# Patient Record
Sex: Female | Born: 1975 | Hispanic: No | Smoking: Current every day smoker
Health system: Southern US, Community
[De-identification: ages and names within clinical notes are randomized; demographics above are authoritative.]

## PROBLEM LIST (undated history)

## (undated) DIAGNOSIS — M5431 Sciatica, right side: Secondary | ICD-10-CM

## (undated) DIAGNOSIS — I10 Essential (primary) hypertension: Secondary | ICD-10-CM

## (undated) DIAGNOSIS — E785 Hyperlipidemia, unspecified: Secondary | ICD-10-CM

## (undated) HISTORY — DX: Essential (primary) hypertension: I10

## (undated) HISTORY — DX: Sciatica, right side: M54.31

## (undated) HISTORY — DX: Hyperlipidemia, unspecified: E78.5

---
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
## 2003-12-03 HISTORY — PX: HAND SURGERY: SHX662

## 2020-01-31 HISTORY — PX: INTRAUTERINE DEVICE INSERTION: SHX323

## 2021-07-18 ENCOUNTER — Ambulatory Visit: Payer: Self-pay | Admitting: Family Medicine

## 2021-07-24 ENCOUNTER — Ambulatory Visit: Payer: Self-pay | Admitting: Internal Medicine

## 2021-08-16 DIAGNOSIS — R030 Elevated blood-pressure reading, without diagnosis of hypertension: Secondary | ICD-10-CM | POA: Diagnosis not present

## 2021-08-16 DIAGNOSIS — Z713 Dietary counseling and surveillance: Secondary | ICD-10-CM | POA: Diagnosis not present

## 2021-08-16 DIAGNOSIS — Z131 Encounter for screening for diabetes mellitus: Secondary | ICD-10-CM | POA: Diagnosis not present

## 2021-08-16 DIAGNOSIS — Z136 Encounter for screening for cardiovascular disorders: Secondary | ICD-10-CM | POA: Diagnosis not present

## 2021-08-16 DIAGNOSIS — Z13220 Encounter for screening for lipoid disorders: Secondary | ICD-10-CM | POA: Diagnosis not present

## 2021-09-24 ENCOUNTER — Encounter: Payer: Self-pay | Admitting: Family Medicine

## 2021-09-24 ENCOUNTER — Ambulatory Visit: Payer: BC Managed Care – PPO | Admitting: Family Medicine

## 2021-09-24 ENCOUNTER — Other Ambulatory Visit: Payer: Self-pay

## 2021-09-24 VITALS — BP 127/70 | HR 63 | Ht 62.0 in | Wt 132.4 lb

## 2021-09-24 DIAGNOSIS — M5431 Sciatica, right side: Secondary | ICD-10-CM

## 2021-09-24 DIAGNOSIS — G5701 Lesion of sciatic nerve, right lower limb: Secondary | ICD-10-CM

## 2021-09-24 DIAGNOSIS — Z7689 Persons encountering health services in other specified circumstances: Secondary | ICD-10-CM | POA: Diagnosis not present

## 2021-09-24 DIAGNOSIS — Z23 Encounter for immunization: Secondary | ICD-10-CM

## 2021-09-24 MED ORDER — BACLOFEN 10 MG PO TABS: 5.0000 mg | ORAL_TABLET | Freq: Three times a day (TID) | ORAL | 2 refills | Status: DC | PRN

## 2021-09-24 MED ORDER — GABAPENTIN 100 MG PO CAPS
ORAL_CAPSULE | ORAL | 2 refills | Status: DC
Start: 2021-09-24 — End: 2021-10-12
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
## 2021-09-24 NOTE — Patient Instructions (Addendum)
Thank you for coming to the office today.  Dr Joseph Berkshire  [facility] [address] [place], [place] [address] [phone]  Stay tuned for apt upcoming.  Start Gabapentin 100mg  capsules, take at night for 2-3 nights only, and then increase to 2 times a day for a few days, and then may increase to 3 times a day, it may make you drowsy, if helps significantly at night only, then you can increase instead to 3 capsules at night, instead of 3 times a day - In the future if needed, we can significantly increase the dose if tolerated well, some common doses are 300mg  three times a day up to 600mg  three times a day, usually it takes several weeks or months to get to higher doses  Start taking Baclofen (Lioresal) 10mg  (muscle relaxant) - start with half (cut) to one whole pill at night as needed for next 1-3 nights (may make you drowsy, caution with driving) see how it affects you, then if tolerated increase to one pill 2 to 3 times a day or (every 8 hours as needed)   Please schedule a Follow-up Appointment to: Return in about 3 months (around 12/25/2021) for 3 month follow-up piriformis/sciatica.  If you have any other questions or concerns, please feel free to call the office or send a message through MyChart. You may also schedule an earlier appointment if necessary.  Additionally, you may be receiving a survey about your experience at our office within a few days to 1 week by e-mail or mail. We value your feedback.  , DO [facility], [facility]     Piriformis Syndrome Rehabilitation Exercises   You may do all of these exercises right away once acute pain starts to improve on medication or treatment.  Piriformis stretch: Lying on your back with both knees bent, rest the ankle of your injured leg over the knee of your uninjured leg. Grasp the thigh of your uninjured leg and pull that knee toward your chest. You will feel a stretch along the  buttocks and possibly along the outside of your hip on the injured side. Hold this for 15 to 30 seconds. Repeat 3 times.  Standing hamstring stretch: Place the heel of your leg on a stool about 15 inches high. Keep your knee straight. Lean forward, bending at the hips until you feel a mild stretch in the back of your thigh. Make sure you do not roll your shoulders and bend at the waist when doing this or you will stretch your lower back instead. Hold the stretch for 15 to 30 seconds. Repeat 3 times.  Pelvic tilt: Lie on your back with your knees bent and your feet flat on the floor. Tighten your abdominal muscles and push your lower back into the floor. Hold this position for 5 seconds, then relax. Do 3 sets of 10.  Partial curl: Lie on your back with your knees bent and your feet flat on the floor. Tighten your stomach muscles and flatten your back against the floor. Tuck your chin to your chest. With your hands stretched out in front of you, curl your upper body forward until your shoulders clear the floor. Hold this position for 3 seconds. Don't hold your breath. It helps to breathe out as you lift your shoulders up. Relax. Repeat 10 times. Build to 3 sets of 10. To challenge yourself, clasp your hands behind your head and keep your elbows out to the side.  Prone  hip extension: Lie on your stomach with your legs straight out behind you. Tighten up your buttocks muscles and lift one leg off the floor about 8 inches. Keep your knee straight. Hold for 5 seconds. Then lower your leg and relax. Do 3 sets of 10.  If both legs are affected, you may repeat this exercise for the other leg.   Piriformis Syndrome Piriformis syndrome is a condition that can cause pain and numbness in your buttocks and down the back of your leg. Piriformis syndrome happens when the small muscle that connects the base of your spine to your hip (piriformis muscle) presses on the nerve that runs down the back of your leg (sciatic  nerve). The piriformis muscle helps your hip rotate and helps to bring your leg back and out. It also helps shift your weight to keep you stable while you are walking. The sciatic nerve runs under or through the piriformis muscle. Damage to the piriformis muscle can cause spasms that put pressure on the nerve below. This causes pain and discomfort while sitting and moving. The pain may feel as if it begins in the buttock and spreads (radiates) down your hip and thigh. What are the causes? This condition is caused by pressure on the sciatic nerve from the piriformis muscle. The piriformis muscle can get irritated with overuse, especially if other hip muscles are weak and the piriformis muscle has to do extra work. Piriformis syndrome can also occur after an injury, like a fall onto your buttocks. What increases the risk? You are more likely to develop this condition if you: Are a woman. Sit for long periods of time. Are a cyclist. Have weak buttocks muscles (gluteal muscles). What are the signs or symptoms? Symptoms of this condition include: Pain, tingling, or numbness that starts in the buttock and runs down the back of your leg (sciatica). Pain in the groin or thigh area. Your symptoms may get worse: The longer you sit. When you walk, run, or climb stairs. When straining to have a bowel movement. How is this diagnosed? This condition is diagnosed based on your symptoms, medical history, and physical exam. During the exam, your health care provider may: Move your leg into different positions to check for pain. Press on the muscles of your hip and buttock to see if that increases your symptoms. You may also have tests, including: Imaging tests such as X-rays, MRI, or ultrasound. Electromyogram (EMG). This test measures electrical signals sent by your nerves into the muscles. Nerve conduction study. This test measures how well electrical signals pass through your nerves. How is this  treated? This condition may be treated by: Stopping all activities that cause pain or make your condition worse. Applying ice or using heat therapy. Taking medicines to reduce pain and swelling. Taking a muscle relaxer (muscle relaxant) to stop muscle spasms. Doing range-of-motion and strengthening exercises (physical therapy) as told by your health care provider. Massaging the area. Having acupuncture. Getting an injection of medicine in the piriformis muscle. Your health care provider will choose the medicine based on your condition. He or she may inject: An anti-inflammatory medicine (steroid) to reduce swelling. A numbing medicine (local anesthetic) to block the pain. Botulinum toxin. The toxin blocks nerve impulses to specific muscles to reduce muscle tension. In rare cases, you may need surgery to cut the muscle and release pressure on the nerve if other treatments do not work. Follow these instructions at home: Activity Do not sit for long periods. Get  up and walk around every 20 minutes or as often as told by your health care provider. When driving long distances, make sure to take frequent stops to get up and stretch. Use a cushion when you sit on hard surfaces. Do exercises as told by your health care provider. Return to your normal activities as told by your health care provider. Ask your health care provider what activities are safe for you. Managing pain, stiffness, and swelling   If directed, apply heat to the affected area as often as told by your health care provider. Use the heat source that your health care provider recommends, such as a moist heat pack or a heating pad. Place a towel between your skin and the heat source. Leave the heat on for 20-30 minutes. Remove the heat if your skin turns bright red. This is especially important if you are unable to feel pain, heat, or cold. You may have a greater risk of getting burned. If directed, put ice on the injured area. Put  ice in a plastic bag. Place a towel between your skin and the bag. Leave the ice on for 20 minutes, 2-3 times a day. General instructions Take over-the-counter and prescription medicines only as told by your health care provider. Ask your health care provider if the medicine prescribed to you requires you to avoid driving or using heavy machinery. You may need to take actions to prevent or treat constipation, such as: Drink enough fluid to keep your urine pale yellow. Take over-the-counter or prescription medicines. Eat foods that are high in fiber, such as beans, whole grains, and fresh fruits and vegetables. Limit foods that are high in fat and processed sugars, such as fried or sweet foods. Keep all follow-up visits as told by your health care provider. This is important. How is this prevented? Do not sit for longer than 20 minutes at a time. When you sit, choose padded surfaces. Warm up and stretch before being active. Cool down and stretch after being active. Give your body time to rest between periods of activity. Make sure to use equipment that fits you. Maintain physical fitness, including: Strength. Flexibility. Contact a health care provider if: Your pain and stiffness continue or get worse. Your leg or hip becomes weak. You have changes in your bowel function or bladder function. Summary Piriformis syndrome is a condition that can cause pain, tingling, and numbness in your buttocks and down the back of your leg. You may try applying heat or ice to relieve the pain. Do not sit for long periods. Get up and walk around every 20 minutes or as often as told by your health care provider. This information is not intended to replace advice given to you by your health care provider. Make sure you discuss any questions you have with your health care provider. Document Revised: 03/11/2019 Document Reviewed: 07/15/2018 Elsevier Patient Education  2022 [employer].

## 2021-09-27 ENCOUNTER — Ambulatory Visit
Admission: RE | Admit: 2021-09-27 | Discharge: 2021-09-27 | Disposition: A | Discharge status: Home / Self Care | Payer: BC Managed Care – PPO | Source: Ambulatory Visit | Attending: Family Medicine | Admitting: Family Medicine

## 2021-09-27 ENCOUNTER — Encounter: Payer: Self-pay | Admitting: Family Medicine

## 2021-09-27 ENCOUNTER — Other Ambulatory Visit: Payer: Self-pay

## 2021-09-27 ENCOUNTER — Ambulatory Visit
Admission: RE | Admit: 2021-09-27 | Discharge: 2021-09-27 | Disposition: A | Discharge status: Home / Self Care | Payer: BC Managed Care – PPO | Attending: Family Medicine | Admitting: Family Medicine

## 2021-09-27 ENCOUNTER — Ambulatory Visit (INDEPENDENT_AMBULATORY_CARE_PROVIDER_SITE_OTHER): Payer: BC Managed Care – PPO | Admitting: Family Medicine

## 2021-09-27 VITALS — BP 128/72 | HR 87 | Ht 62.0 in | Wt 131.0 lb

## 2021-09-27 DIAGNOSIS — M5431 Sciatica, right side: Secondary | ICD-10-CM | POA: Insufficient documentation

## 2021-09-27 DIAGNOSIS — M545 Low back pain, unspecified: Secondary | ICD-10-CM | POA: Diagnosis not present

## 2021-09-27 DIAGNOSIS — G5701 Lesion of sciatic nerve, right lower limb: Secondary | ICD-10-CM

## 2021-09-27 MED ORDER — PREDNISONE 50 MG PO TABS: 50.0000 mg | ORAL_TABLET | Freq: Every day | ORAL | 0 refills | Status: DC

## 2021-09-27 MED ORDER — MELOXICAM 15 MG PO TABS: 15.0000 mg | ORAL_TABLET | Freq: Every day | ORAL | 0 refills | Status: DC

## 2021-09-27 NOTE — Assessment & Plan Note (Signed)
See additional assessment(s) for plan details. 

## 2021-09-27 NOTE — Patient Instructions (Signed)
-   Obtain low back x-rays - Continue previously prescribed medications by Dr. Althea Charon, increase slowly as he has instructed - Dose prednisone once daily until complete - After prednisone complete, stop meloxicam and dose once daily with food - This weekend start home exercises and focus on gradual advance - Return for follow-up in 2 weeks, contact us for questions between now and then

## 2021-09-27 NOTE — Assessment & Plan Note (Signed)
Patient with chronic right gluteal pain with paresthesias to the right foot, denies any overt weakness, no bowel/bladder dysfunction.  She denies any overt back pain but does feel frequent "popping "of the low back with regular movement.  Pain is progressively worsened with activity, particularly weightbearing.  Some alleviation with slight hip flexion and flexed knee positioning.  She has noted some improvement following her primary care physician, Dr., Althea Charon' prescription for gabapentin and baclofen, plans to titrate this dose up today or tomorrow.  Physical examination findings reveals painful lumbar extension, tenderness at the right SI joint, gluteus medius mid/distal to the posterior greater trochanter on the right, maximal tenderness at the piriformis, positive Faber, positive piriformis stretch, negative straight leg raise, iliopsoas testing benign.  There is positive facet loading of the lumbosacral spine with appreciable crepitations at shift.  Sensorimotor otherwise intact and symmetric bilateral lower extremities.  Her physical examination findings and clinical history are most consistent with piriformis syndrome on the right side as noted by her PCP, this can be considered secondary/compensatory to possible generalized deconditioning versus a sequela from a lumbosacral spondylosis.  Given her findings we will plan to obtain lumbar films and given her stated severity of symptoms, 5-day course of prednisone followed by once daily meloxicam, modification of home exercises, and continued medications as per Dr. Althea Charon.  She will maintain close follow-up in 2 weeks for reevaluation, if suboptimal response noted, pending focality of symptoms and radiographs, ultrasound-guided piriformis injection at the junction of the static nerve to be considered as can right sacroiliac joint injection.  Once symptoms adequately controlled, she would benefit from formal PT versus continue to advance with  home-based rehab.

## 2021-09-27 NOTE — Progress Notes (Signed)
Primary Care / Sports Medicine Office Visit  Patient Information:  Patient ID: Sonia Eaton, female DOB: 11/30/1976 Age: 45 y.o. MRN: [id number]   Jaydyn Menon is a pleasant 45 y.o. female presenting with the following:  Chief Complaint  Patient presents with   Piriformis syndrome of right side    Several years per patient, 2014; incarcerated from 2015-2021 so unable to seek medical care until now; no known trauma or injury; prescribed gabapentin 100 mg and baclofen as needed via PCP 09/24/21 with no relief; no imaging; 10/10 pain   Chronic sciatica, right    Review of Systems pertinent details above   Patient Active Problem List   Diagnosis Date Noted   Piriformis syndrome of right side 09/24/2021   Chronic sciatica, right 09/24/2021   Past Medical History:  Diagnosis Date   Hyperlipidemia    Hypertension    Sciatica of right side    Outpatient Encounter Medications as of 09/27/2021  Medication Sig   Aspirin-Salicylamide-Caffeine (BC HEADACHE POWDER PO) Take 1 Package by mouth 2 (two) times a week.   baclofen (LIORESAL) 10 MG tablet Take 0.5-1 tablets (5-10 mg total) by mouth 3 (three) times daily as needed for muscle spasms.   Buprenorphine HCl-Naloxone HCl 8-2 MG FILM Place 1 Film under the tongue daily.   esomeprazole (NEXIUM) 20 MG capsule Take 20 mg by mouth daily.   gabapentin (NEURONTIN) 100 MG capsule Start 1 capsule daily, increase by 1 cap every 2-3 days as tolerated up to 3 times a day, or may take 3 at once in evening.   ibuprofen (ADVIL) 200 MG tablet Take 200 mg by mouth every 6 (six) hours as needed.   meloxicam (MOBIC) 15 MG tablet Take 1 tablet (15 mg total) by mouth daily.   predniSONE (DELTASONE) 50 MG tablet Take 1 tablet (50 mg total) by mouth daily.   No facility-administered encounter medications on file as of 09/27/2021.   Past Surgical History:  Procedure Laterality Date   CESAREAN SECTION  2014   CHOLECYSTECTOMY  1996   HAND SURGERY  2005    carpal tunnel   INTRAUTERINE DEVICE INSERTION  01/2020    Vitals:   09/27/21 1447  BP: 128/72  Pulse: 87  SpO2: 98%   Vitals:   09/27/21 1447  Weight: 131 lb (59.4 kg)  Height: 5\' 2"  (1.575 m)   Body mass index is 23.96 kg/m.  No results found.   Independent interpretation of notes and tests performed by another provider:   None  Procedures performed:   None  Pertinent History, Exam, Impression, and Recommendations:   Piriformis syndrome of right side Patient with chronic right gluteal pain with paresthesias to the right foot, denies any overt weakness, no bowel/bladder dysfunction.  She denies any overt back pain but does feel frequent "popping "of the low back with regular movement.  Pain is progressively worsened with activity, particularly weightbearing.  Some alleviation with slight hip flexion and flexed knee positioning.  She has noted some improvement following her primary care physician, Dr., ' prescription for gabapentin and baclofen, plans to titrate this dose up today or tomorrow.  Physical examination findings reveals painful lumbar extension, tenderness at the right SI joint, gluteus medius mid/distal to the posterior greater trochanter on the right, maximal tenderness at the piriformis, positive Faber, positive piriformis stretch, negative straight leg raise, iliopsoas testing benign.  There is positive facet loading of the lumbosacral spine with appreciable crepitations at shift.  Sensorimotor otherwise intact  and symmetric bilateral lower extremities.  Her physical examination findings and clinical history are most consistent with piriformis syndrome on the right side as noted by her PCP, this can be considered secondary/compensatory to possible generalized deconditioning versus a sequela from a lumbosacral spondylosis.  Given her findings we will plan to obtain lumbar films and given her stated severity of symptoms, 5-day course of prednisone  followed by once daily meloxicam, modification of home exercises, and continued medications as per Dr. Althea Charon.  She will maintain close follow-up in 2 weeks for reevaluation, if suboptimal response noted, pending focality of symptoms and radiographs, ultrasound-guided piriformis injection at the junction of the static nerve to be considered as can right sacroiliac joint injection.  Once symptoms adequately controlled, she would benefit from formal PT versus continue to advance with home-based rehab.  Chronic sciatica, right See additional assessment(s) for plan details.   Orders & Medications Meds ordered this encounter  Medications   predniSONE (DELTASONE) 50 MG tablet    Sig: Take 1 tablet (50 mg total) by mouth daily.    Dispense:  5 tablet    Refill:  0   meloxicam (MOBIC) 15 MG tablet    Sig: Take 1 tablet (15 mg total) by mouth daily.    Dispense:  14 tablet    Refill:  0   Orders Placed This Encounter  Procedures   DG Lumbar Spine Complete     Return in about 2 weeks (around 10/11/2021).     Jerrol Banana, MD   Primary Care Sports Medicine [facility]

## 2021-10-09 ENCOUNTER — Ambulatory Visit: Payer: BC Managed Care – PPO | Admitting: Family Medicine

## 2021-10-12 ENCOUNTER — Encounter: Payer: Self-pay | Admitting: Family Medicine

## 2021-10-12 ENCOUNTER — Ambulatory Visit: Payer: BC Managed Care – PPO | Admitting: Family Medicine

## 2021-10-12 ENCOUNTER — Other Ambulatory Visit: Payer: Self-pay

## 2021-10-12 VITALS — BP 142/80 | HR 80 | Ht 63.0 in | Wt 130.0 lb

## 2021-10-12 DIAGNOSIS — M5431 Sciatica, right side: Secondary | ICD-10-CM

## 2021-10-12 DIAGNOSIS — G5701 Lesion of sciatic nerve, right lower limb: Secondary | ICD-10-CM | POA: Diagnosis not present

## 2021-10-12 DIAGNOSIS — M5416 Radiculopathy, lumbar region: Secondary | ICD-10-CM | POA: Diagnosis not present

## 2021-10-12 MED ORDER — MELOXICAM 15 MG PO TABS: 15.0000 mg | ORAL_TABLET | Freq: Every day | ORAL | 1 refills | Status: DC

## 2021-10-12 MED ORDER — GABAPENTIN 600 MG PO TABS: 600.0000 mg | ORAL_TABLET | Freq: Every day | ORAL | 3 refills | Status: DC

## 2021-10-12 NOTE — Assessment & Plan Note (Signed)
See additional assessment(s) for plan details. 

## 2021-10-12 NOTE — Progress Notes (Signed)
Primary Care / Sports Medicine Office Visit  Patient Information:  Patient ID: Sonia Eaton, female DOB: [birth year] Age: 45 y.o. MRN: [id number]   Sonia Eaton is a pleasant 45 y.o. female presenting with the following:  Chief Complaint  Patient presents with   Follow-up    Patient continues to have side pain that now radiates down her leg.  She is continuing to take her Gabapentin and Baclofen.  She has finishsed the Prednisone and Meloxicam.     Review of Systems pertinent details above   Patient Active Problem List   Diagnosis Date Noted   Right lumbar radiculitis 10/12/2021   Piriformis syndrome of right side 09/24/2021   Chronic sciatica, right 09/24/2021   Past Medical History:  Diagnosis Date   Hyperlipidemia    Hypertension    Outpatient Encounter Medications as of 10/12/2021  Medication Sig   Aspirin-Salicylamide-Caffeine (BC HEADACHE POWDER PO) Take 1 Package by mouth 2 (two) times a week.   baclofen (LIORESAL) 10 MG tablet Take 0.5-1 tablets (5-10 mg total) by mouth 3 (three) times daily as needed for muscle spasms.   Buprenorphine HCl-Naloxone HCl 8-2 MG FILM Place 1 Film under the tongue daily.   esomeprazole (NEXIUM) 20 MG capsule Take 20 mg by mouth daily.   gabapentin (NEURONTIN) 600 MG tablet Take 1 tablet (600 mg total) by mouth at bedtime. Gradually increase every 3 days to TID dosing   [DISCONTINUED] gabapentin (NEURONTIN) 100 MG capsule Start 1 capsule daily, increase by 1 cap every 2-3 days as tolerated up to 3 times a day, or may take 3 at once in evening.   ibuprofen (ADVIL) 200 MG tablet Take 200 mg by mouth every 6 (six) hours as needed. (Patient not taking: Reported on 10/12/2021)   meloxicam (MOBIC) 15 MG tablet Take 1 tablet (15 mg total) by mouth daily.   [DISCONTINUED] meloxicam (MOBIC) 15 MG tablet Take 1 tablet (15 mg total) by mouth daily.   [DISCONTINUED] predniSONE (DELTASONE) 50 MG tablet Take 1 tablet (50 mg total) by mouth daily.    No facility-administered encounter medications on file as of 10/12/2021.   Past Surgical History:  Procedure Laterality Date   CESAREAN SECTION  2014   CHOLECYSTECTOMY  1996   HAND SURGERY  2005   carpal tunnel   INTRAUTERINE DEVICE INSERTION  01/2020    Vitals:   10/12/21 1034  BP: (!) 142/80  Pulse: 80  SpO2: 98%   Vitals:   10/12/21 1034  Weight: 130 lb (59 kg)  Height: 5\' 3"  (1.6 m)   Body mass index is 23.03 kg/m.  DG Lumbar Spine Complete  Result Date: 09/29/2021 CLINICAL DATA:  Chronic right back pain EXAM: LUMBAR SPINE - COMPLETE 4+ VIEW COMPARISON:  None. FINDINGS: 5 mm anterolisthesis of L4 upon L5. Otherwise normal lumbar lordosis. Mild lumbar dextroscoliosis, apex right at L2. No acute fracture. Vertebral body height is preserved. There is intervertebral disc space narrowing and endplate remodeling at L3-4 and L4-5 in keeping with changes of mild degenerative disc disease. The paraspinal soft tissues are unremarkable. Intrauterine device overlies the mid pelvis. IMPRESSION: No acute fracture or listhesis. Mild degenerative disc disease L3-L5 with grade 1 anterolisthesis at L4-5. Electronically Signed   By: 10/01/2021 M.D.   On: 09/29/2021 20:47     Independent interpretation of notes and tests performed by another provider:   Independent interpretation of lumbar x-rays dated 09/27/2021 reveals subtle scoliotic curvature on AP view, multilevel degenerative changes most  evident at L3-4, L4-5, L5-S1 where there are anterior endplate osteophytes, increasing facet hypertrophy and sclerosis distally, and grade 1 anterolisthesis of L4 on L5  Procedures performed:   None  Pertinent History, Exam, Impression, and Recommendations:   Right lumbar radiculitis Patient has demonstrated interval near-resolution of right piriformis related symptomatology which she attributes to prednisone course, has not noted recurrence since that time.  She has been compliant with  home-based exercises and medication dosing.  She currently feels that while her right posterior hip symptoms have improved, she is noting progression of right lower extremity paresthesia.  Denies any bowel/bladder dysfunction, no weakness.  Recent radiographs reveal multilevel degenerative changes at the lumbar spine, for physical exam is positive for straight leg raise on the right, piriformis testing now benign.  Overall clinical picture is most consistent with lumbosacral radiculopathy with now resolved compensatory changes at the paraspinal lumbar and deep hip musculature.  I have advised various treatment strategies and she is amenable to further titration of gabapentin to 600 mg nightly with goal, if tolerable, to 3 times daily dosing, continued daily meloxicam, as needed baclofen, and added focus on home-based rehab.  I did offer formal physical therapy but she will consider in the future.  She is to return for follow-up in 4 weeks for clinical reevaluation.  Pending symptoms at that time, if suboptimal, can consider advanced imaging, further medication management, formal PT.  If improved, wean from medications, ramp up activity, and follow accordingly.  Chronic sciatica, right See additional assessment(s) for plan details.  Piriformis syndrome of right side See additional assessment(s) for plan details.    Orders & Medications Meds ordered this encounter  Medications   meloxicam (MOBIC) 15 MG tablet    Sig: Take 1 tablet (15 mg total) by mouth daily.    Dispense:  30 tablet    Refill:  1   gabapentin (NEURONTIN) 600 MG tablet    Sig: Take 1 tablet (600 mg total) by mouth at bedtime. Gradually increase every 3 days to TID dosing    Dispense:  180 tablet    Refill:  3   No orders of the defined types were placed in this encounter.    Return in about 4 weeks (around 11/09/2021).     Jerrol Banana, MD   Primary Care Sports Medicine [facility]

## 2021-10-12 NOTE — Assessment & Plan Note (Signed)
Patient has demonstrated interval near-resolution of right piriformis related symptomatology which she attributes to prednisone course, has not noted recurrence since that time.  She has been compliant with home-based exercises and medication dosing.  She currently feels that while her right posterior hip symptoms have improved, she is noting progression of right lower extremity paresthesia.  Denies any bowel/bladder dysfunction, no weakness.  Recent radiographs reveal multilevel degenerative changes at the lumbar spine, for physical exam is positive for straight leg raise on the right, piriformis testing now benign.  Overall clinical picture is most consistent with lumbosacral radiculopathy with now resolved compensatory changes at the paraspinal lumbar and deep hip musculature.  I have advised various treatment strategies and she is amenable to further titration of gabapentin to 600 mg nightly with goal, if tolerable, to 3 times daily dosing, continued daily meloxicam, as needed baclofen, and added focus on home-based rehab.  I did offer formal physical therapy but she will consider in the future.  She is to return for follow-up in 4 weeks for clinical reevaluation.  Pending symptoms at that time, if suboptimal, can consider advanced imaging, further medication management, formal PT.  If improved, wean from medications, ramp up activity, and follow accordingly.

## 2021-10-12 NOTE — Patient Instructions (Signed)
-   Start nightly gabapentin 600 mg a.m. to increase to 600 mg 3 times a day if symptoms allow - Continue meloxicam once daily with food - Can dose baclofen (muscle relaxer) on an as-needed basis for muscle tightness pain - Continue advancing home exercises with information previously provided - Contact her office for any questions, concerns, issues, or refills between now and follow-up - Return for follow-up in 4 weeks

## 2021-11-09 ENCOUNTER — Ambulatory Visit: Payer: BC Managed Care – PPO | Admitting: Family Medicine

## 2021-11-14 ENCOUNTER — Encounter: Payer: Self-pay | Admitting: Family Medicine

## 2021-11-15 ENCOUNTER — Ambulatory Visit: Payer: BC Managed Care – PPO | Admitting: Family Medicine

## 2021-12-24 ENCOUNTER — Other Ambulatory Visit: Payer: Self-pay

## 2021-12-24 ENCOUNTER — Ambulatory Visit: Payer: BC Managed Care – PPO | Admitting: Family Medicine

## 2021-12-24 ENCOUNTER — Encounter: Payer: Self-pay | Admitting: Family Medicine

## 2021-12-24 DIAGNOSIS — M5416 Radiculopathy, lumbar region: Secondary | ICD-10-CM | POA: Diagnosis not present

## 2021-12-24 DIAGNOSIS — G5701 Lesion of sciatic nerve, right lower limb: Secondary | ICD-10-CM

## 2021-12-24 DIAGNOSIS — M5431 Sciatica, right side: Secondary | ICD-10-CM | POA: Diagnosis not present

## 2021-12-24 MED ORDER — MELOXICAM 15 MG PO TABS: 15.0000 mg | ORAL_TABLET | Freq: Every day | ORAL | 2 refills | Status: DC | PRN

## 2021-12-24 MED ORDER — GABAPENTIN 100 MG PO CAPS
100.0000 mg | ORAL_CAPSULE | Freq: Every day | ORAL | 1 refills | Status: DC
Start: 2021-12-24 — End: 2022-03-11

## 2021-12-24 MED ORDER — DULOXETINE HCL 30 MG PO CPEP: ORAL_CAPSULE | ORAL | 0 refills | Status: DC

## 2021-12-24 NOTE — Progress Notes (Signed)
°  ° °  Primary Care / Sports Medicine Office Visit  Patient Information:  Patient ID: Erisa Mehlman, female DOB: Sep 22, 1976 Age: 46 y.o. MRN: [id number]   Jadin Kagel is a pleasant 46 y.o. female presenting with the following:  Chief Complaint  Patient presents with   Right lumbar radiculitis   Piriformis syndrome of right side   Chronic sciatica, right    Vitals:   12/24/21 1014  BP: 118/78  Pulse: (!) 59  SpO2: 99%   Vitals:   12/24/21 1014  Weight: 129 lb (58.5 kg)  Height: 5\' 3"  (1.6 m)   Body mass index is 22.85 kg/m.  No results found.   Independent interpretation of notes and tests performed by another provider:   None  Procedures performed:   None  Pertinent History, Exam, Impression, and Recommendations:   Right lumbar radiculitis Chronic issue that is currently uncontrolled.  While at the last visit she had noted essential resolution of right piriformis/right posterior hip symptoms, she continues to note continued paresthesias in the right thigh with radiation to the right lower leg foot/ankle.  She has started a virtual PT program, ongoing for several weeks after the initial home PT exercises I had written for.  She is dosing meloxicam scheduled and has found that she can only tolerate half tablet of gabapentin 600 mg.  Given that symptoms have continued greater than 6 weeks despite her compliance with rehab regimen, medications, plan for MRI of the lumbar spine without contrast to further evaluate the skeletal abnormalities noted on her lumbar x-rays.  Additionally, medication management performed with a transition of meloxicam to as needed dosing, 100-300 mg nightly dosing of gabapentin, as needed baclofen, and initiation of duloxetine 30 mg with titration to 60 mg after 2 weeks.  Once the MRI results are in, we will coordinate a virtual visit to assess her response to medications, continued home PT, and review results to determine the need for referral to  pain and spine versus continued current management.   Orders & Medications Meds ordered this encounter  Medications   DULoxetine (CYMBALTA) 30 MG capsule    Sig: Take 1 capsule (30 mg total) by mouth every evening for 14 days, THEN 2 capsules (60 mg total) every evening for 16 days.    Dispense:  46 capsule    Refill:  0   gabapentin (NEURONTIN) 100 MG capsule    Sig: Take 1-3 capsules (100-300 mg total) by mouth at bedtime.    Dispense:  90 capsule    Refill:  1   meloxicam (MOBIC) 15 MG tablet    Sig: Take 1 tablet (15 mg total) by mouth daily as needed for pain.    Dispense:  30 tablet    Refill:  2   Orders Placed This Encounter  Procedures   MR Lumbar Spine Wo Contrast     Return if symptoms worsen or fail to improve.     , MD   Primary Care Sports Medicine [facility]

## 2021-12-24 NOTE — Patient Instructions (Signed)
-   Continue meloxicam, can dose this daily on an as-needed basis (take with food) - Transition gabapentin to 1-3 capsules nightly - Continue baclofen, can dose this nightly on an as-needed basis - Start duloxetine, 1 capsule in the evening x2 weeks, then 2 capsules in the evening - Continue home exercises through virtual PT - Referral coordinator will contact you for MRI scheduling - We will coordinate a virtual follow-up to review the MRI and assess your symptoms/discussed next steps

## 2021-12-24 NOTE — Assessment & Plan Note (Signed)
Chronic issue that is currently uncontrolled.  While at the last visit she had noted essential resolution of right piriformis/right posterior hip symptoms, she continues to note continued paresthesias in the right thigh with radiation to the right lower leg foot/ankle.  She has started a virtual PT program, ongoing for several weeks after the initial home PT exercises I had written for.  She is dosing meloxicam scheduled and has found that she can only tolerate half tablet of gabapentin 600 mg.  Given that symptoms have continued greater than 6 weeks despite her compliance with rehab regimen, medications, plan for MRI of the lumbar spine without contrast to further evaluate the skeletal abnormalities noted on her lumbar x-rays.  Additionally, medication management performed with a transition of meloxicam to as needed dosing, 100-300 mg nightly dosing of gabapentin, as needed baclofen, and initiation of duloxetine 30 mg with titration to 60 mg after 2 weeks.  Once the MRI results are in, we will coordinate a virtual visit to assess her response to medications, continued home PT, and review results to determine the need for referral to pain and spine versus continued current management.

## 2021-12-25 ENCOUNTER — Ambulatory Visit: Payer: BC Managed Care – PPO | Admitting: Family Medicine

## 2021-12-25 ENCOUNTER — Encounter: Payer: Self-pay | Admitting: Family Medicine

## 2021-12-25 VITALS — BP 122/74 | HR 82 | Ht 63.0 in | Wt 127.6 lb

## 2021-12-25 DIAGNOSIS — G5701 Lesion of sciatic nerve, right lower limb: Secondary | ICD-10-CM | POA: Diagnosis not present

## 2021-12-25 DIAGNOSIS — M5416 Radiculopathy, lumbar region: Secondary | ICD-10-CM | POA: Diagnosis not present

## 2021-12-25 DIAGNOSIS — M5431 Sciatica, right side: Secondary | ICD-10-CM

## 2021-12-25 NOTE — Patient Instructions (Addendum)
Thank you for coming to the office today.  Keep up the plan with Dr Ashley Royalty on the MRI  Agree with Duloxetine trial now, may take 2-4 weeks to take effect. Keep on other meds as you are for now.  Follow-up as need.  DUE for FASTING BLOOD WORK (no food or drink after midnight before the lab appointment, only water or coffee without cream/sugar on the morning of)  SCHEDULE "Lab Only" visit in the morning at the clinic for lab draw in 2-3 WEEKS   - Make sure Lab Only appointment is at about 1 week before your next appointment, so that results will be available  For Lab Results, once available within 2-3 days of blood draw, you can can log in to MyChart online to view your results and a brief explanation. Also, we can discuss results at next follow-up visit.   Please schedule a Follow-up Appointment to: Return in about 2 weeks (around 01/08/2022) for 2-3 weeks fasting lab only then 1 week later Annual Physical.  If you have any other questions or concerns, please feel free to call the office or send a message through MyChart. You may also schedule an earlier appointment if necessary.  Additionally, you may be receiving a survey about your experience at our office within a few days to 1 week by e-mail or mail. We value your feedback.  Saralyn Pilar, DO [facility], [place]

## 2021-12-25 NOTE — Progress Notes (Signed)
Subjective:    Patient ID: Sonia Eaton, female    DOB: May 01, 1976, 46 y.o.   MRN: [id number]  Sonia Eaton is a 46 y.o. female presenting on 12/25/2021 for Sciatica   HPI  Chronic Low Back Pain Right Sciatica  - Last visit with me 09/24/21, for initial visit for same problem R piriformis / sciatica, treated with Baclofen and Gabapentin and referred to Sports Medicine, see prior notes for background information. - Interval update with established with Sports Med, given courses of anti inflammatory mobic, and prednisone, has done X-rays Lumbar spine, identified some DDD see below, she was increased on Gabapentin dosing up to max 600 TID but she could not tolerate that dose due to grogginess sleepiness. - Yesterday she saw Dr Ashley Royalty and was started on Duloxetine generic cymbalta for pain, dose taper up, has not started med yet today, MRI Lumbar ordered but not yet scheduled. - Today patient reports doing well but still having issue with sciatica R sided neuropathic radicular pain. - Continues on her Gabapentin lower dose now 100-300mg  QHS, also on Baclofen PRN and meloxicam PRN   Depression screen [facility] 2/9 12/24/2021 10/12/2021 09/27/2021  Decreased Interest 0 0 0  Down, Depressed, Hopeless 0 0 0  PHQ - 2 Score 0 0 0  Altered sleeping 0 0 0  Tired, decreased energy 0 1 0  Change in appetite 0 0 0  Feeling bad or failure about yourself  0 0 0  Trouble concentrating 0 0 0  Moving slowly or fidgety/restless 0 0 0  Suicidal thoughts 0 0 0  PHQ-9 Score 0 1 0  Difficult doing work/chores Not difficult at all Not difficult at all Not difficult at all    Social History   Tobacco Use   Smoking status: Every Day    Packs/day: 0.50    Years: 30.00    Pack years: 15.00    Types: Cigarettes   Smokeless tobacco: Never  Vaping Use   Vaping Use: Never used  Substance Use Topics   Alcohol use: Not Currently   Drug use: Not Currently    Types: Marijuana    Review of Systems Per HPI  unless specifically indicated above     Objective:    BP 122/74    Pulse 82    Ht 5\' 3"  (1.6 m)    Wt 127 lb 9.6 oz (57.9 kg)    SpO2 100%    BMI 22.60 kg/m   Wt Readings from Last 3 Encounters:  12/25/21 127 lb 9.6 oz (57.9 kg)  12/24/21 129 lb (58.5 kg)  10/12/21 130 lb (59 kg)    Physical Exam Vitals and nursing note reviewed.  Constitutional:      General: She is not in acute distress.    Appearance: Normal appearance. She is well-developed. She is not diaphoretic.     Comments: Well-appearing, comfortable, cooperative  HENT:     Head: Normocephalic and atraumatic.  Eyes:     General:        Right eye: No discharge.        Left eye: No discharge.     Conjunctiva/sclera: Conjunctivae normal.  Cardiovascular:     Rate and Rhythm: Normal rate.  Pulmonary:     Effort: Pulmonary effort is normal.  Skin:    General: Skin is warm and dry.     Findings: No erythema or rash.  Neurological:     Mental Status: She is alert and oriented to person, place, and time.  Psychiatric:        Mood and Affect: Mood normal.        Behavior: Behavior normal.        Thought Content: Thought content normal.     Comments: Well groomed, good eye contact, normal speech and thoughts    I have personally reviewed the radiology report from 09/27/21 on Lumbar X-ray.  CLINICAL DATA:  Chronic right back pain   EXAM: LUMBAR SPINE - COMPLETE 4+ VIEW   COMPARISON:  None.   FINDINGS: 5 mm anterolisthesis of L4 upon L5. Otherwise normal lumbar lordosis. Mild lumbar dextroscoliosis, apex right at L2. No acute fracture. Vertebral body height is preserved. There is intervertebral disc space narrowing and endplate remodeling at L3-4 and L4-5 in keeping with changes of mild degenerative disc disease. The paraspinal soft tissues are unremarkable. Intrauterine device overlies the mid pelvis.   IMPRESSION: No acute fracture or listhesis.   Mild degenerative disc disease L3-L5 with grade 1  anterolisthesis at L4-5.     Electronically Signed   By: Helyn Numbers M.D.   On: 09/29/2021 20:47  No results found for this or any previous visit.    Assessment & Plan:   Problem List Items Addressed This Visit     Right lumbar radiculitis   Piriformis syndrome of right side   Chronic sciatica, right - Primary    Chronic LBP, w sciatica lumbar radiculitis Followed by Sports Medicine Dr Ashley Royalty currently Some improvement overall with therapy, and medication management On Duloxetine SNRI trial now, discussed anticipate may take a few weeks for benefit Continue other therapy w/ dose adjusted Gabapentin MRI ordered by Sports Med  No orders of the defined types were placed in this encounter.     Follow up plan: Return in about 2 weeks (around 01/08/2022) for 2-3 weeks fasting lab only then 1 week later Annual Physical.  Future labs ordered for 01/08/22 CMET CBC LIPID A1c HIV HEP C   Saralyn Pilar, DO [facility] 12/25/2021, 8:38 AM

## 2021-12-26 ENCOUNTER — Other Ambulatory Visit: Payer: Self-pay | Admitting: Family Medicine

## 2021-12-26 ENCOUNTER — Encounter: Payer: Self-pay | Admitting: Family Medicine

## 2021-12-26 DIAGNOSIS — Z131 Encounter for screening for diabetes mellitus: Secondary | ICD-10-CM

## 2021-12-26 DIAGNOSIS — Z1159 Encounter for screening for other viral diseases: Secondary | ICD-10-CM

## 2021-12-26 DIAGNOSIS — E7800 Pure hypercholesterolemia, unspecified: Secondary | ICD-10-CM

## 2021-12-26 DIAGNOSIS — Z114 Encounter for screening for human immunodeficiency virus [HIV]: Secondary | ICD-10-CM

## 2021-12-26 DIAGNOSIS — Z0000 Encounter for general adult medical examination without abnormal findings: Secondary | ICD-10-CM

## 2022-01-03 ENCOUNTER — Ambulatory Visit: Payer: BC Managed Care – PPO

## 2022-01-03 ENCOUNTER — Other Ambulatory Visit: Payer: Self-pay

## 2022-01-03 ENCOUNTER — Ambulatory Visit
Admission: RE | Admit: 2022-01-03 | Discharge: 2022-01-03 | Disposition: A | Discharge status: Home / Self Care | Payer: BC Managed Care – PPO | Source: Ambulatory Visit | Attending: Family Medicine | Admitting: Family Medicine

## 2022-01-03 DIAGNOSIS — M5416 Radiculopathy, lumbar region: Secondary | ICD-10-CM

## 2022-01-03 DIAGNOSIS — M5431 Sciatica, right side: Secondary | ICD-10-CM

## 2022-01-03 DIAGNOSIS — M545 Low back pain, unspecified: Secondary | ICD-10-CM | POA: Diagnosis not present

## 2022-01-03 DIAGNOSIS — M79604 Pain in right leg: Secondary | ICD-10-CM | POA: Diagnosis not present

## 2022-01-07 ENCOUNTER — Encounter: Payer: Self-pay | Admitting: Family Medicine

## 2022-01-08 ENCOUNTER — Other Ambulatory Visit: Payer: BC Managed Care – PPO

## 2022-01-08 DIAGNOSIS — Z0000 Encounter for general adult medical examination without abnormal findings: Secondary | ICD-10-CM

## 2022-01-08 DIAGNOSIS — Z131 Encounter for screening for diabetes mellitus: Secondary | ICD-10-CM

## 2022-01-08 DIAGNOSIS — Z1159 Encounter for screening for other viral diseases: Secondary | ICD-10-CM

## 2022-01-08 DIAGNOSIS — Z114 Encounter for screening for human immunodeficiency virus [HIV]: Secondary | ICD-10-CM

## 2022-01-08 DIAGNOSIS — E7800 Pure hypercholesterolemia, unspecified: Secondary | ICD-10-CM

## 2022-01-10 ENCOUNTER — Encounter: Payer: Self-pay | Admitting: Family Medicine

## 2022-01-10 ENCOUNTER — Telehealth (INDEPENDENT_AMBULATORY_CARE_PROVIDER_SITE_OTHER): Payer: BC Managed Care – PPO | Admitting: Family Medicine

## 2022-01-10 ENCOUNTER — Other Ambulatory Visit: Payer: Self-pay

## 2022-01-10 VITALS — Ht 63.0 in

## 2022-01-10 DIAGNOSIS — M4727 Other spondylosis with radiculopathy, lumbosacral region: Secondary | ICD-10-CM | POA: Diagnosis not present

## 2022-01-10 DIAGNOSIS — G5701 Lesion of sciatic nerve, right lower limb: Secondary | ICD-10-CM

## 2022-01-10 DIAGNOSIS — M5431 Sciatica, right side: Secondary | ICD-10-CM

## 2022-01-10 DIAGNOSIS — M5416 Radiculopathy, lumbar region: Secondary | ICD-10-CM

## 2022-01-10 NOTE — Assessment & Plan Note (Addendum)
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  Additionally we modified her medications to transition from meloxicam to as needed dosing, titration of nightly gabapentin, as needed baclofen, and initiation of duloxetine with titration to 60 mg after 2 weeks.  Despite these measures she continues to be symptomatic, we reviewed the MRI findings which do show focality to the L3-4 and L4-5 regions of the lumbar spine, and discussed treatment strategies.  She is amenable to further evaluation and discussion of management options by pain and spine group, a referral was placed today.  Over the interim she is to continue current medication regimen, and follow-up with [place] on as-needed basis.  Chronic condition, uncontrolled, independent image review

## 2022-01-10 NOTE — Progress Notes (Signed)
Primary Care / Sports Medicine Virtual Visit  Patient Information:  Patient ID: Tricha Ruggirello, female DOB: 1976/08/30 Age: 46 y.o. MRN: [id number]   Venesha Petraitis is a pleasant 46 y.o. female presenting with the following:  Chief Complaint  Patient presents with   Results    MRI results      Review of Systems: No fevers, chills, night sweats, weight loss, chest pain, or shortness of breath.   Patient Active Problem List   Diagnosis Date Noted   Multilevel lumbosacral spondylosis with radiculopathy 01/10/2022   Right lumbar radiculitis 10/12/2021   Piriformis syndrome of right side 09/24/2021   Chronic sciatica, right 09/24/2021   Past Medical History:  Diagnosis Date   Hyperlipidemia    Hypertension    Outpatient Encounter Medications as of 01/10/2022  Medication Sig   Aspirin-Salicylamide-Caffeine (BC HEADACHE POWDER PO) Take 1 Package by mouth 2 (two) times a week.   baclofen (LIORESAL) 10 MG tablet Take 0.5-1 tablets (5-10 mg total) by mouth 3 (three) times daily as needed for muscle spasms.   Buprenorphine HCl-Naloxone HCl 8-2 MG FILM Place 1 Film under the tongue daily.   DULoxetine (CYMBALTA) 30 MG capsule Take 1 capsule (30 mg total) by mouth every evening for 14 days, THEN 2 capsules (60 mg total) every evening for 16 days.   esomeprazole (NEXIUM) 20 MG capsule Take 20 mg by mouth daily.   gabapentin (NEURONTIN) 100 MG capsule Take 1-3 capsules (100-300 mg total) by mouth at bedtime.   meloxicam (MOBIC) 15 MG tablet Take 1 tablet (15 mg total) by mouth daily as needed for pain.   No facility-administered encounter medications on file as of 01/10/2022.   Past Surgical History:  Procedure Laterality Date   CESAREAN SECTION  2014   CHOLECYSTECTOMY  1996   HAND SURGERY  2005   carpal tunnel   INTRAUTERINE DEVICE INSERTION  01/2020    Virtual Visit via MyChart Video:   I connected with Luciana Axe on 01/10/22 via MyChart Video and verified that I am speaking  with the correct person using appropriate identifiers.   The limitations, risks, security and privacy concerns of performing an evaluation and management service by MyChart Video, including the higher likelihood of inaccurate diagnoses and treatments, and the availability of in person appointments were reviewed. The possible need of an additional face-to-face encounter for complete and high quality delivery of care was discussed. The patient was also made aware that there may be a patient responsible charge related to this service. The patient expressed understanding and wishes to proceed.  Provider location is in medical facility. Patient location is at their home, different from provider location. People involved in care of the patient during this telehealth encounter were myself, my nurse/medical assistant, and my front office/scheduling team member.  Objective findings:   General: Speaking full sentences, no audible heavy breathing. Sounds alert and appropriately interactive. Well-appearing. Face symmetric. Extraocular movements intact. Pupils equal and round. No nasal flaring or accessory muscle use visualized.  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  Additionally we modified her medications to  transition from meloxicam to as needed dosing, titration of nightly gabapentin, as needed baclofen, and initiation of duloxetine with titration to 60 mg after 2 weeks.  Despite these measures she continues to be symptomatic, we reviewed the MRI findings which do show focality to the L3-4 and L4-5 regions of the lumbar spine, and discussed treatment strategies.  She is amenable to further evaluation and discussion of management options by pain and spine group, a referral was placed today.  Over the interim she is to continue current medication regimen, and follow-up with [place] on as-needed basis.  Chronic condition, uncontrolled, independent image review  Orders & Medications No orders of the defined types were placed in this encounter.  No orders of the defined types were placed in this encounter.    I discussed the above assessment and treatment plan with the patient. The patient was provided an opportunity to ask questions and all were answered. The patient agreed with the plan and demonstrated an understanding of the instructions.   The patient was advised to call back or seek an in-person evaluation if the symptoms worsen or if the condition fails to improve as anticipated.     Jerrol Banana, MD   Primary Care Sports Medicine [facility]

## 2022-01-15 ENCOUNTER — Ambulatory Visit (INDEPENDENT_AMBULATORY_CARE_PROVIDER_SITE_OTHER): Payer: BC Managed Care – PPO | Admitting: Family Medicine

## 2022-01-15 ENCOUNTER — Encounter: Payer: Self-pay | Admitting: Family Medicine

## 2022-01-15 ENCOUNTER — Other Ambulatory Visit: Payer: Self-pay

## 2022-01-15 VITALS — BP 123/88 | HR 67 | Ht 63.0 in | Wt 125.8 lb

## 2022-01-15 DIAGNOSIS — Z0000 Encounter for general adult medical examination without abnormal findings: Secondary | ICD-10-CM

## 2022-01-15 DIAGNOSIS — Z1231 Encounter for screening mammogram for malignant neoplasm of breast: Secondary | ICD-10-CM

## 2022-01-15 DIAGNOSIS — Z114 Encounter for screening for human immunodeficiency virus [HIV]: Secondary | ICD-10-CM | POA: Diagnosis not present

## 2022-01-15 DIAGNOSIS — I7300 Raynaud's syndrome without gangrene: Secondary | ICD-10-CM | POA: Diagnosis not present

## 2022-01-15 DIAGNOSIS — E7800 Pure hypercholesterolemia, unspecified: Secondary | ICD-10-CM | POA: Diagnosis not present

## 2022-01-15 DIAGNOSIS — Z1159 Encounter for screening for other viral diseases: Secondary | ICD-10-CM | POA: Diagnosis not present

## 2022-01-15 DIAGNOSIS — Z131 Encounter for screening for diabetes mellitus: Secondary | ICD-10-CM | POA: Diagnosis not present

## 2022-01-15 NOTE — Patient Instructions (Addendum)
Thank you for coming to the office today.  For Mammogram screening for breast cancer   Call the [facility] below anytime to schedule your own appointment now that order has been placed.  [facility] [address], [place] [address] Phone: [phone]  -------------------------------------------------  Check on the Pap Smear results from last year.  Colon Cancer Screening: - For all adults age 46+ routine colon cancer screening is highly recommended.     - Recent guidelines from [place] recommend starting age of 28 - Early detection of colon cancer is important, because often there are no warning signs or symptoms, also if found early usually it can be cured. Late stage is hard to treat.  - If you are not interested in Colonoscopy screening (if done and normal you could be cleared for 5 to 10 years until next due), then Cologuard is an excellent alternative for screening test for Colon Cancer. It is highly sensitive for detecting DNA of colon cancer from even the earliest stages. Also, there is NO bowel prep required. - If Cologuard is NEGATIVE, then it is good for 3 years before next due - If Cologuard is POSITIVE, then it is strongly advised to get a Colonoscopy, which allows the GI doctor to locate the source of the cancer or polyp (even very early stage) and treat it by removing it. ------------------------- If you would like to proceed with Cologuard (stool DNA test) - FIRST, call your insurance company and tell them you want to check cost of Cologuard tell them CPT Code [phone] (it may be completely covered and you could get for no cost, OR max cost without any coverage is about $600). Also, keep in mind if you do NOT open the kit, and decide not to do the test, you will NOT be charged, you should contact the company if you decide not to do the test. - If you want to proceed, you can notify us (phone  message, [place], or at next visit) and we will order it for you. The test kit will be delivered to you house within about 1 week. Follow instructions to collect sample, you may call the company for any help or questions, 24/7 telephone support at [phone].   Please schedule a Follow-up Appointment to: Return in about 6 months (around 07/15/2022) for 6 month follow-up BP re-check, Raynauds, back updates, smoking.  If you have any other questions or concerns, please feel free to call the office or send a message through [place]. You may also schedule an earlier appointment if necessary.  Additionally, you may be receiving a survey about your experience at our office within a few days to 1 week by e-mail or mail. We value your feedback.  Nobie Putnam, DO [place]

## 2022-01-15 NOTE — Progress Notes (Signed)
Subjective:    Patient ID: Sonia Eaton, female    DOB: [birth year], 46 y.o.   MRN: [id number]  Sonia Eaton is a 46 y.o. female presenting on 01/15/2022 for Annual Exam   HPI  Here for Annual Physical and Lab Review.  Elevated BP Reports home BP readings, 120-130s / 70-80s and question if previous readings were accurate. Usually 121-125 range. Current Meds - Not on medication   Denies CP, dyspnea, HA, edema, dizziness / lightheadedness  Updates Chronic Low Back Pain / sciatica, recently referred to Sports Medicine Dr Zigmund Daniel and ultimately she has proceeded to Lumbar spine MRI done recently and now waiting on current referral to Spine Specialist injection therapy.  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 Recent flare worsening after resumed smoking.   Health Maintenance:  Pap Smear screening - reported setup through insurance, done last year [place], and she will check result. If not needs GYN    Depression screen [facility] 2/9 01/15/2022 01/10/2022 12/24/2021  Decreased Interest 0 0 0  Down, Depressed, Hopeless 0 0 0  PHQ - 2 Score 0 0 0  Altered sleeping 0 0 0  Tired, decreased energy 0 0 0  Change in appetite 0 0 0  Feeling bad or failure about yourself  0 0 0  Trouble concentrating 0 0 0  Moving slowly or fidgety/restless 0 0 0  Suicidal thoughts 0 0 0  PHQ-9 Score 0 0 0  Difficult doing work/chores Not difficult at all Not difficult at all Not difficult at all    Past Medical History:  Diagnosis Date   Hyperlipidemia    Hypertension    Past Surgical History:  Procedure Laterality Date   CESAREAN SECTION  2014   [place]   HAND SURGERY  2005   carpal tunnel   INTRAUTERINE DEVICE INSERTION  01/2020   Social History   Socioeconomic History   Marital status: Divorced    Spouse name: Not on file   Number of children: Not on file   Years of education: Not on file    Highest education level: Not on file  Occupational History   Not on file  Tobacco Use   Smoking status: Every Day    Packs/day: 0.50    Years: 30.00    Pack years: 15.00    Types: Cigarettes   Smokeless tobacco: Never  Vaping Use   Vaping Use: Never used  Substance and Sexual Activity   Alcohol use: Not Currently   Drug use: Not Currently    Types: Marijuana   Sexual activity: Yes    Birth control/protection: I.U.D.  Other Topics Concern   Not on file  Social History Narrative   Not on file   Social Determinants of Health   Financial Resource Strain: Not on file  Food Insecurity: Not on file  Transportation Needs: Not on file  Physical Activity: Not on file  Stress: Not on file  Social Connections: Not on file  Intimate Partner Violence: Not on file   Family History  Problem Relation Age of Onset   Diabetes Mother    Heart attack Father 18   Diabetes Maternal Grandmother    Heart attack Maternal Grandfather    Colon cancer Neg Hx    Current Outpatient Medications on File Prior to Visit  Medication Sig   Aspirin-Salicylamide-Caffeine (BC HEADACHE POWDER PO) Take 1 Package by mouth 2 (two) times a week.   baclofen (LIORESAL) 10  MG tablet Take 0.5-1 tablets (5-10 mg total) by mouth 3 (three) times daily as needed for muscle spasms.   Buprenorphine HCl-Naloxone HCl 8-2 MG FILM Place 1 Film under the tongue daily.   DULoxetine (CYMBALTA) 30 MG capsule Take 1 capsule (30 mg total) by mouth every evening for 14 days, THEN 2 capsules (60 mg total) every evening for 16 days.   esomeprazole (NEXIUM) 20 MG capsule Take 20 mg by mouth daily.   gabapentin (NEURONTIN) 100 MG capsule Take 1-3 capsules (100-300 mg total) by mouth at bedtime.   meloxicam (MOBIC) 15 MG tablet Take 1 tablet (15 mg total) by mouth daily as needed for pain.   No current facility-administered medications on file prior to visit.    Review of Systems  Constitutional:  Negative for activity change,  appetite change, chills, diaphoresis, fatigue and fever.  HENT:  Negative for congestion and hearing loss.   Eyes:  Negative for visual disturbance.  Respiratory:  Negative for cough, chest tightness, shortness of breath and wheezing.   Cardiovascular:  Negative for chest pain, palpitations and leg swelling.  Gastrointestinal:  Negative for abdominal pain, constipation, diarrhea, nausea and vomiting.  Genitourinary:  Negative for dysuria, frequency and hematuria.  Musculoskeletal:  Negative for arthralgias and neck pain.  Skin:  Negative for rash.  Neurological:  Negative for dizziness, weakness, light-headedness, numbness and headaches.  Hematological:  Negative for adenopathy.  Psychiatric/Behavioral:  Negative for behavioral problems, dysphoric mood and sleep disturbance.   Per HPI unless specifically indicated above     Objective:    BP 123/88    Pulse 67    Ht _0  (1.6 m)    Wt 125 lb 12.8 oz (57.1 kg)    SpO2 100%    BMI 22.28 kg/m   Wt Readings from Last 3 Encounters:  01/15/22 125 lb 12.8 oz (57.1 kg)  12/25/21 127 lb 9.6 oz (57.9 kg)  12/24/21 129 lb (58.5 kg)    Physical Exam Vitals and nursing note reviewed.  Constitutional:      General: She is not in acute distress.    Appearance: She is well-developed. She is not diaphoretic.     Comments: Well-appearing, comfortable, cooperative  HENT:     Head: Normocephalic and atraumatic.  Eyes:     General:        Right eye: No discharge.        Left eye: No discharge.     Conjunctiva/sclera: Conjunctivae normal.     Pupils: Pupils are equal, round, and reactive to light.  Neck:     Thyroid: No thyromegaly.  Cardiovascular:     Rate and Rhythm: Normal rate and regular rhythm.     Pulses: Normal pulses.     Heart sounds: Normal heart sounds. No murmur heard. Pulmonary:     Effort: Pulmonary effort is normal. No respiratory distress.     Breath sounds: Normal breath sounds. No wheezing or rales.  Abdominal:      General: Bowel sounds are normal. There is no distension.     Palpations: Abdomen is soft. There is no mass.     Tenderness: There is no abdominal tenderness.  Musculoskeletal:        General: No tenderness. Normal range of motion.     Cervical back: Normal range of motion and neck supple.     Comments: Upper / Lower Extremities: - Normal muscle tone, strength bilateral upper extremities 5/5, lower extremities 5/5  Lymphadenopathy:  Cervical: No cervical adenopathy.  Skin:    General: Skin is warm and dry.     Findings: No erythema or rash.  Neurological:     Mental Status: She is alert and oriented to person, place, and time.     Comments: Distal sensation intact to light touch all extremities  Psychiatric:        Mood and Affect: Mood normal.        Behavior: Behavior normal.        Thought Content: Thought content normal.     Comments: Well groomed, good eye contact, normal speech and thoughts    MR Lumbar Spine Wo Contrast [[id number]] Resulted: 01/04/22 2212  Order Status: Completed Updated: 01/04/22 2214  Narrative:    CLINICAL DATA:  Right leg pain over the last 3-4 years.   EXAM:  MRI LUMBAR SPINE WITHOUT CONTRAST   TECHNIQUE:  Multiplanar, multisequence MR imaging of the lumbar spine was  performed. No intravenous contrast was administered.   COMPARISON:  Radiography 09/27/2021   FINDINGS:  Segmentation:  5 lumbar type vertebral bodies.   Alignment: Mild scoliotic curvature convex to the right. 1 or 2 mm  of degenerative anterolisthesis at L4-5.   Vertebrae: No fracture or focal bone lesion. Edematous facet  arthropathy on the right at L4-5 and on the left at L3-4 as  described below.   Conus medullaris and cauda equina: Conus extends to the T12-L1  level. Conus and cauda equina appear normal.   Paraspinal and other soft tissues: Negative   Disc levels:   No significant finding from T11-12 through L2-3.   L3-4: Bilateral facet osteoarthritis with 1  or 2 mm of  anterolisthesis. Bulging of the disc, focally prominent in the left  foraminal to extraforaminal region. Mild stenosis of the lateral  recesses, left more than right. Potential to affect the exiting left  L3 nerve because of the asymmetric disc. Facet osteoarthritis,  particularly on the right, could be a cause of back pain or referred  facet syndrome pain.   L4-5: Facet osteoarthritis with 1-2 mm of anterolisthesis. Bulging  of the disc with a left foraminal to extraforaminal disc herniation.  Stenosis of both lateral recesses that could be symptomatic.  Potential compress both L4 nerves, particularly the left because of  the asymmetric disc material. Facet osteoarthritis shows edematous  change on the right and could be a cause of back pain or referred  facet syndrome pain, particularly on the right.   L5-S1: Normal appearance of the disc. Minimal facet degeneration. No  stenosis.   IMPRESSION:  Mild scoliotic curvature convex to the right.   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 Bulging of the disc with a small left  foraminal to extraforaminal disc protrusion. Mild stenosis of both  lateral recesses. The L3 nerve on the left could be affected in the  foraminal to extraforaminal region. Facet arthropathy is more  pronounced on the right and could be associated with back pain or  referred facet syndrome pain.   L4-5: Bilateral facet arthropathy with 1 or 2 mm of anterolisthesis.  Bulging of the disc with a left foraminal to extraforaminal disc  herniation. Stenosis of both lateral recesses could possibly cause  neural compression. The exiting left L4 nerve would likely be  affected by the foraminal to extraforaminal disc herniation. The  facet arthropathy could be a cause of back pain or referred facet  syndrome pain.    Electronically Signed    By: Jan Fireman.D.  On: 01/04/2022 22:12     Results for orders placed or performed in visit on 01/08/22  Hemoglobin A1c   Result Value Ref Range   Hgb A1c MFr Bld 5.2 <5.7 % of total Hgb   Mean Plasma Glucose 103 mg/dL   eAG (mmol/L) 5.7 mmol/L  CBC with Differential/Platelet  Result Value Ref Range   WBC 7.6 3.8 - 10.8 Thousand/uL   RBC 4.09 3.80 - 5.10 Million/uL   Hemoglobin 13.0 11.7 - 15.5 g/dL   HCT 39.5 35.0 - 45.0 %   MCV 96.6 80.0 - 100.0 fL   MCH 31.8 27.0 - 33.0 pg   MCHC 32.9 32.0 - 36.0 g/dL   RDW 11.7 11.0 - 15.0 %   Platelets 310 140 - 400 Thousand/uL   MPV 10.5 7.5 - 12.5 fL   Neutro Abs 4,393 1,500 - 7,800 cells/uL   Lymphs Abs 2,668 850 - 3,900 cells/uL   Absolute Monocytes 471 200 - 950 cells/uL   Eosinophils Absolute 38 15 - 500 cells/uL   Basophils Absolute 30 0 - 200 cells/uL   Neutrophils Relative % 57.8 %   Total Lymphocyte 35.1 %   Monocytes Relative 6.2 %   Eosinophils Relative 0.5 %   Basophils Relative 0.4 %  Lipid panel  Result Value Ref Range   Cholesterol 193 <200 mg/dL   HDL 50 > OR = 50 mg/dL   Triglycerides 64 <150 mg/dL   LDL Cholesterol (Calc) 127 (H) mg/dL (calc)   Total CHOL/HDL Ratio 3.9 <5.0 (calc)   Non-HDL Cholesterol (Calc) 143 (H) <130 mg/dL (calc)  COMPLETE METABOLIC PANEL WITH GFR  Result Value Ref Range   Glucose, Bld 88 65 - 99 mg/dL   BUN 10 7 - 25 mg/dL   Creat 0.63 0.50 - 0.99 mg/dL   eGFR 111 > OR = 60 mL/min/1.16m   BUN/Creatinine Ratio NOT APPLICABLE 6 - 22 (calc)   Sodium 142 135 - 146 mmol/L   Potassium 4.0 3.5 - 5.3 mmol/L   Chloride 106 98 - 110 mmol/L   CO2 29 20 - 32 mmol/L   Calcium 9.6 8.6 - 10.2 mg/dL   Total Protein 6.8 6.1 - 8.1 g/dL   Albumin 4.3 3.6 - 5.1 g/dL   Globulin 2.5 1.9 - 3.7 g/dL (calc)   AG Ratio 1.7 1.0 - 2.5 (calc)   Total Bilirubin 0.3 0.2 - 1.2 mg/dL   Alkaline phosphatase (APISO) 64 31 - 125 U/L   AST 17 10 - 35 U/L   ALT 14 6 - 29 U/L      Assessment & Plan:   Problem List Items Addressed This Visit   None Visit Diagnoses     Annual physical exam    -  Primary   Encounter for screening  mammogram for malignant neoplasm of breast       Relevant Orders   MM DIGITAL SCREENING BILATERAL   Raynaud's phenomenon without gangrene           Updated Health Maintenance information Mammogram ordered Offered Colon CA Screening, she defers for now Request copy of last pap smear, or refer to GYN Reviewed recent lab results with patient Encouraged improvement to lifestyle with diet and exercise Goal of weight loss  Raynaud's Phenomenon Clinically with characteristic flares in fingers, episodic if cold situation provokes, discussed cause and symptoms and risk if progression Encourage smoking cessation. Future consider Amlodipine CCB if indicated  BP Elevated Mild elevated Discussed close BP monitoring.  No orders of the  defined types were placed in this encounter.     Follow up plan: Return in about 6 months (around 07/15/2022) for 6 month follow-up BP re-check, Raynauds, back updates, smoking.  Nobie Putnam, [facility] 01/15/2022, 2:16 PM

## 2022-01-19 LAB — HIV ANTIBODY (ROUTINE TESTING W REFLEX): HIV 1&2 Ab, 4th Generation: NONREACTIVE

## 2022-01-19 LAB — COMPLETE METABOLIC PANEL WITH GFR
AG Ratio: 1.7 (calc) (ref 1.0–2.5)
ALT: 14 U/L (ref 6–29)
AST: 17 U/L (ref 10–35)
Albumin: 4.3 g/dL (ref 3.6–5.1)
Alkaline phosphatase (APISO): 64 U/L (ref 31–125)
BUN: 10 mg/dL (ref 7–25)
CO2: 29 mmol/L (ref 20–32)
Calcium: 9.6 mg/dL (ref 8.6–10.2)
Chloride: 106 mmol/L (ref 98–110)
Creat: 0.63 mg/dL (ref 0.50–0.99)
Globulin: 2.5 g/dL (calc) (ref 1.9–3.7)
Glucose, Bld: 88 mg/dL (ref 65–99)
Potassium: 4 mmol/L (ref 3.5–5.3)
Sodium: 142 mmol/L (ref 135–146)
Total Bilirubin: 0.3 mg/dL (ref 0.2–1.2)
Total Protein: 6.8 g/dL (ref 6.1–8.1)
eGFR: 111 mL/min/{1.73_m2} (ref >=60)

## 2022-01-19 LAB — LIPID PANEL
Cholesterol: 193 mg/dL (ref <200)
HDL: 50 mg/dL (ref >=50)
LDL Cholesterol (Calc): 127 mg/dL (calc) — ABNORMAL HIGH
Non-HDL Cholesterol (Calc): 143 mg/dL (calc) — ABNORMAL HIGH (ref <130)
Total CHOL/HDL Ratio: 3.9 (calc) (ref <5.0)
Triglycerides: 64 mg/dL (ref <150)

## 2022-01-19 LAB — CBC WITH DIFFERENTIAL/PLATELET
Absolute Monocytes: 471 cells/uL (ref 200–950)
Basophils Absolute: 30 cells/uL (ref 0–200)
Basophils Relative: 0.4 %
Eosinophils Absolute: 38 cells/uL (ref 15–500)
Eosinophils Relative: 0.5 %
HCT: 39.5 % (ref 35.0–45.0)
Hemoglobin: 13 g/dL (ref 11.7–15.5)
Lymphs Abs: 2668 cells/uL (ref 850–3900)
MCH: 31.8 pg (ref 27.0–33.0)
MCHC: 32.9 g/dL (ref 32.0–36.0)
MCV: 96.6 fL (ref 80.0–100.0)
MPV: 10.5 fL (ref 7.5–12.5)
Monocytes Relative: 6.2 %
Neutro Abs: 4393 cells/uL (ref 1500–7800)
Neutrophils Relative %: 57.8 %
Platelets: 310 10*3/uL (ref 140–400)
RBC: 4.09 10*6/uL (ref 3.80–5.10)
RDW: 11.7 % (ref 11.0–15.0)
Total Lymphocyte: 35.1 %
WBC: 7.6 10*3/uL (ref 3.8–10.8)

## 2022-01-19 LAB — HEMOGLOBIN A1C
Hgb A1c MFr Bld: 5.2 % of total Hgb (ref <5.7)
Mean Plasma Glucose: 103 mg/dL
eAG (mmol/L): 5.7 mmol/L

## 2022-01-19 LAB — HCV RNA,QUANTITATIVE REAL TIME PCR
HCV Quantitative Log: <1.18 Log IU/mL
HCV RNA, PCR, QN: <15 IU/mL

## 2022-01-19 LAB — HEPATITIS C ANTIBODY
Hepatitis C Ab: REACTIVE — AB
SIGNAL TO CUT-OFF: >11 — ABNORMAL HIGH (ref <1.00)

## 2022-01-26 ENCOUNTER — Other Ambulatory Visit: Payer: Self-pay | Admitting: Family Medicine

## 2022-01-26 DIAGNOSIS — G5701 Lesion of sciatic nerve, right lower limb: Secondary | ICD-10-CM

## 2022-01-26 DIAGNOSIS — M5431 Sciatica, right side: Secondary | ICD-10-CM

## 2022-01-26 DIAGNOSIS — M5416 Radiculopathy, lumbar region: Secondary | ICD-10-CM

## 2022-01-28 ENCOUNTER — Other Ambulatory Visit: Payer: Self-pay

## 2022-01-28 DIAGNOSIS — G5701 Lesion of sciatic nerve, right lower limb: Secondary | ICD-10-CM

## 2022-01-28 DIAGNOSIS — M5416 Radiculopathy, lumbar region: Secondary | ICD-10-CM

## 2022-01-28 DIAGNOSIS — M5431 Sciatica, right side: Secondary | ICD-10-CM

## 2022-01-28 MED ORDER — DULOXETINE HCL 60 MG PO CPEP: 60.0000 mg | ORAL_CAPSULE | Freq: Every evening | ORAL | 0 refills | Status: DC

## 2022-01-28 NOTE — Telephone Encounter (Signed)
When should patient follow-up with you?  Pending pain management appointment.

## 2022-01-28 NOTE — Telephone Encounter (Signed)
Requested medication (s) are due for refill today: Yes  Requested medication (s) are on the active medication list: Yes  Last refill:  12/24/21  Future visit scheduled: No  Notes to clinic:  See request.    Requested Prescriptions  Pending Prescriptions Disp Refills   DULoxetine (CYMBALTA) 30 MG capsule [Pharmacy Med Name: DULOXETINE DR 30MG CAPSULES] 46 capsule 0    Sig: TAKE 1 CAPSULE(30 MG) BY MOUTH EVERY EVENING FOR 14 DAYS THEN TAKE 2 CAPSULES(60 MG) BY MOUTH EVERY EVENING FOR 16 DAYS     Psychiatry: Antidepressants - SNRI - duloxetine Passed - 01/26/2022  3:20 AM      Passed - Cr in normal range and within 360 days    Creat  Date Value Ref Range Status  01/15/2022 0.63 0.50 - 0.99 mg/dL Final          Passed - eGFR is 30 or above and within 360 days    eGFR  Date Value Ref Range Status  01/15/2022 111 > OR = 60 mL/min/1.33m Final    Comment:    The eGFR is based on the CKD-EPI 2021 equation. To calculate  the new eGFR from a previous Creatinine or Cystatin C result, go to https://www.kidney.org/professionals/ kdoqi/gfr%5Fcalculator           Passed - Completed PHQ-2 or PHQ-9 in the last 360 days      Passed - Last BP in normal range    BP Readings from Last 1 Encounters:  01/15/22 123/88          Passed - Valid encounter within last 6 months    Recent Outpatient Visits           1 week ago Annual physical exam   [place]   2 weeks ago Multilevel [facility]umbosacral spondylosis with radicul[facility]   1 month ago Chronic sciatica, right   [place]   1 month [facility]   3 months [facility]

## 2022-01-28 NOTE — Telephone Encounter (Signed)
Left message for patient to return call.

## 2022-02-01 ENCOUNTER — Encounter: Payer: Self-pay | Admitting: Family Medicine

## 2022-02-04 ENCOUNTER — Ambulatory Visit: Payer: BC Managed Care – PPO | Admitting: Family Medicine

## 2022-03-10 ENCOUNTER — Encounter: Payer: Self-pay | Admitting: Family Medicine

## 2022-03-11 ENCOUNTER — Other Ambulatory Visit: Payer: Self-pay

## 2022-03-11 DIAGNOSIS — M5416 Radiculopathy, lumbar region: Secondary | ICD-10-CM

## 2022-03-11 DIAGNOSIS — G5701 Lesion of sciatic nerve, right lower limb: Secondary | ICD-10-CM

## 2022-03-11 DIAGNOSIS — M5431 Sciatica, right side: Secondary | ICD-10-CM

## 2022-03-11 MED ORDER — GABAPENTIN 100 MG PO CAPS: 100.0000 mg | ORAL_CAPSULE | Freq: Every day | ORAL | 0 refills | Status: DC

## 2022-03-11 MED ORDER — MELOXICAM 15 MG PO TABS: 15.0000 mg | ORAL_TABLET | Freq: Every day | ORAL | 0 refills | Status: DC | PRN

## 2022-03-11 MED ORDER — BACLOFEN 10 MG PO TABS: 5.0000 mg | ORAL_TABLET | Freq: Three times a day (TID) | ORAL | 0 refills | Status: DC | PRN

## 2022-03-11 NOTE — Telephone Encounter (Signed)
Ok to refill 

## 2022-03-19 ENCOUNTER — Ambulatory Visit
Payer: BC Managed Care – PPO | Attending: Student in an Organized Health Care Education/Training Program | Admitting: Student in an Organized Health Care Education/Training Program

## 2022-03-19 ENCOUNTER — Encounter: Payer: Self-pay | Admitting: Student in an Organized Health Care Education/Training Program

## 2022-03-19 VITALS — BP 142/92 | HR 57 | Temp 97.9°F | Resp 16 | Ht 62.0 in | Wt 120.0 lb

## 2022-03-19 DIAGNOSIS — G5701 Lesion of sciatic nerve, right lower limb: Secondary | ICD-10-CM | POA: Diagnosis not present

## 2022-03-19 DIAGNOSIS — M79604 Pain in right leg: Secondary | ICD-10-CM | POA: Insufficient documentation

## 2022-03-19 DIAGNOSIS — M533 Sacrococcygeal disorders, not elsewhere classified: Secondary | ICD-10-CM

## 2022-03-19 DIAGNOSIS — G8929 Other chronic pain: Secondary | ICD-10-CM

## 2022-03-19 NOTE — Patient Instructions (Signed)
______________________________________________________________________  Preparing for Procedure with Sedation  NOTICE: Due to recent regulatory changes, starting on July 02, 2021, procedures requiring intravenous (IV) sedation will no longer be performed at the [facility].  These types of procedures are required to be performed at [facility] ambulatory surgery facility.  We are very sorry for the inconvenience.  Procedure appointments are limited to planned procedures: No Prescription Refills. No disability issues will be discussed. No medication changes will be discussed.  Instructions: Oral Intake: Do not eat or drink anything for at least 8 hours prior to your procedure. (Exception: Blood Pressure Medication. See below.) Transportation: A driver is required. You may not drive yourself after the procedure. Blood Pressure Medicine: Do not forget to take your blood pressure medicine with a sip of water the morning of the procedure. If your Diastolic (lower reading) is above 100 mmHg, elective cases will be cancelled/rescheduled. Blood thinners: These will need to be stopped for procedures. Notify our staff if you are taking any blood thinners. Depending on which one you take, there will be specific instructions on how and when to stop it. Diabetics on insulin: Notify the staff so that you can be scheduled 1st case in the morning. If your diabetes requires high dose insulin, take only  of your normal insulin dose the morning of the procedure and notify the staff that you have done so. Preventing infections: Shower with an antibacterial soap the morning of your procedure. Build-up your immune system: Take 1000 mg of Vitamin C with every meal (3 times a day) the day prior to your procedure. Antibiotics: Inform the staff if you have a condition or reason that requires you to take antibiotics before dental procedures. Pregnancy: If you are pregnant, call and cancel the procedure. Sickness: If  you have a cold, fever, or any active infections, call and cancel the procedure. Arrival: You must be in the facility at least 30 minutes prior to your scheduled procedure. Children: Do not bring children with you. Dress appropriately: Bring dark clothing that you would not mind if they get stained. Valuables: Do not bring any jewelry or valuables.  Reasons to call and reschedule or cancel your procedure: (Following these recommendations will minimize the risk of a serious complication.) Surgeries: Avoid having procedures within 2 weeks of any surgery. (Avoid for 2 weeks before or after any surgery). Flu Shots: Avoid having procedures within 2 weeks of a flu shots. (Avoid for 2 weeks before or after immunizations). Barium: Avoid having a procedure within 7-10 days after having had a radiological study involving the use of radiological contrast. (Myelograms, Barium swallow or enema study). Heart attacks: Avoid any elective procedures or surgeries for the initial 6 months after a "Myocardial Infarction" (Heart Attack). Blood thinners: It is imperative that you stop these medications before procedures. Let us know if you if you take any blood thinner.  Infection: Avoid procedures during or within two weeks of an infection (including chest colds or gastrointestinal problems). Symptoms associated with infections include: Localized redness, fever, chills, night sweats or profuse sweating, burning sensation when voiding, cough, congestion, stuffiness, runny nose, sore throat, diarrhea, nausea, vomiting, cold or Flu symptoms, recent or current infections. It is specially important if the infection is over the area that we intend to treat. Heart and lung problems: Symptoms that may suggest an active cardiopulmonary problem include: cough, chest pain, breathing difficulties or shortness of breath, dizziness, ankle swelling, uncontrolled high or unusually low blood pressure, and/or palpitations. If you are    experiencing any of these symptoms, cancel your procedure and contact your primary care physician for an evaluation.  Remember:  Regular Business hours are:  Monday to Thursday 8:00 AM to 4:00 PM  Provider's Schedule: Francisco Naveira, MD:  Procedure days: Tuesday and Thursday 7:30 AM to 4:00 PM  Bilal Lateef, MD:  Procedure days: Monday and Wednesday 7:30 AM to 4:00 PM ______________________________________________________________________   

## 2022-03-19 NOTE — Progress Notes (Addendum)
Patient: Sonia Eaton  Service Category: E/M  Provider: Edward Jolly, MD  DOB: 1976-05-18  DOS: 03/19/2022  Referring Provider: Jerrol Banana, MD  MRN: [id number]  Setting: Ambulatory outpatient  PCP: Smitty Cords, DO  Type: New Patient  Specialty: Interventional Pain Management    Location: Office  Delivery: Face-to-face     Primary Reason(s) for Visit: Encounter for initial evaluation of one or more chronic problems (new to examiner) potentially causing chronic pain, and posing a threat to normal musculoskeletal function. (Level of risk: High) CC: Hip Pain (Right, pyriformis)  HPI  [name] is a 46 y.o. year old, female patient, who comes for the first time to our practice referred by Jerrol Banana, MD for our initial evaluation of her chronic pain. She has Piriformis syndrome of right side; Chronic sciatica, right; Right lumbar radiculitis; Multilevel lumbosacral spondylosis with radiculopathy; Chronic right SI joint pain; and Right leg pain on their problem list. Today she comes in for evaluation of her Hip Pain (Right, pyriformis)  Pain Assessment: Location: Right Hip Radiating: tingling in right leg Onset: More than a month ago Duration: Chronic pain Quality: Cramping Severity: 4 /10 (subjective, self-reported pain score)  Effect on ADL: difficulty performing daily activities Timing: Constant Modifying factors: stretching, PT BP: (!) 142/92  HR: (!) 57  Onset and Duration: Date of onset: 02/29/1976 Cause of pain: Unknown Severity: Getting better, NAS-11 at its worse: 10/10, NAS-11 at its best: 3/10, NAS-11 now: 4/10, and NAS-11 on the average: 4/10 Timing: Not influenced by the time of the day Aggravating Factors: Lifiting, Prolonged sitting, Prolonged standing, and Walking Alleviating Factors: Bending, Stretching, Lying down, Medications, Resting, Sleeping, and PT Associated Problems: Day-time cramps, Night-time cramps, Numbness, Spasms, Tingling, Pain that  wakes patient up, and Pain that does not allow patient to sleep Quality of Pain: Aching, Annoying, Burning, Constant, Deep, Feeling of constriction, Sharp, Shooting, Splitting, Throbbing, and Tingling Previous Examinations or Tests: MRI scan, X-rays, and Orthopedic evaluation Previous Treatments: Physical Therapy, Steroid treatments by mouth, and Stretching exercises   Sonia Eaton presents today for new patient visit.  Her chief complaint is right buttock pain with radiation to her right leg.  She also notices tingling and cramping in her right leg.  This has been going on since last summer.  No inciting or traumatic event.  She has been seen by her primary care provider, Dr. Kirtland Bouchard.  She was then referred to Dr. Ashley Royalty with sports medicine.  He went on to do an MRI of her lumbar spine, results of which are below.  Patient has worked with physical therapy with limited response.  She is currently on gabapentin 100 mg nightly.  Higher doses resulted in sedation and cognitive dysfunction.  She is also tried baclofen, Mobic, Cymbalta with limited response.  I informed Shmya that we will be focusing on interventional pain therapies to help address her pain.  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  She did not reveal any past history concerning for substance use disorder.  She has been off of her Suboxone for the last 3 months.  No change in her pain.  Meds   Current Outpatient Medications:    Aspirin-Salicylamide-Caffeine (BC HEADACHE POWDER PO), Take 1 Package by mouth 2 (two) times a week., Disp: , Rfl:    baclofen (LIORESAL) 10 MG tablet, Take 0.5-1 tablets (5-10 mg total) by mouth 3 (three) times daily as needed for muscle spasms., Disp: 42 each, Rfl: 0   esomeprazole (  NEXIUM) 20 MG capsule, Take 20 mg by mouth daily., Disp: , Rfl:    gabapentin (NEURONTIN) 100 MG capsule, Take 1-3 capsules (100-300 mg total) by mouth at bedtime., Disp: 42 capsule, Rfl: 0   meloxicam (MOBIC)  15 MG tablet, Take 1 tablet (15 mg total) by mouth daily as needed for pain., Disp: 14 tablet, Rfl: 0  Imaging Review  CM[facility] Lumbar Spine Wo Contrast  Narrative CLINICAL DATA:  Right leg pain over the last 3-4 years.  EXAM: MRI LUMBAR SPINE WITHOUT CONTRAST  TECHNIQUE: Multiplanar, multisequence MR imaging of the lumbar spine was performed. No intravenous contrast was administered.  COMPARISON:  Radiography 09/27/2021  FINDINGS: Segmentation:  5 lumbar type vertebral bodies.  Alignment: Mild scoliotic curvature convex to the right. 1 or 2 mm of degenerative anterolisthesis at L4-5.  Vertebrae: No fracture or focal bone lesion. Edematous facet arthropathy on the right at L4-5 and on the left at L3-4 as described below.  Conus medullaris and cauda equina: Conus extends to the T12-L1 level. Conus and cauda equina appear normal.  Paraspinal and other soft tissues: Negative  Disc levels:  No significant finding from T11-12 through L2-3.  L3-4: Bilateral facet osteoarthritis with 1 or 2 mm of anterolisthesis. Bulging of the disc, focally prominent in the left foraminal to extraforaminal region. Mild stenosis of the lateral recesses, left more than right. Potential to affect the exiting left L3 nerve because of the asymmetric disc. Facet osteoarthritis, particularly on the right, could be a cause of back pain or referred facet syndrome pain.  L4-5: Facet osteoarthritis with 1-2 mm of anterolisthesis. Bulging of the disc with a left foraminal to extraforaminal disc herniation. Stenosis of both lateral recesses that could be symptomatic. Potential compress both L4 nerves, particularly the left because of the asymmetric disc material. Facet osteoarthritis shows edematous change on the right and could be a cause of back pain or referred facet syndrome pain, particularly on the right.  L5-S1: Normal appearance of the disc. Minimal facet degeneration.  No stenosis.  IMPRESSION: Mild scoliotic curvature convex to the right.  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 Bulging of the disc with a small left foraminal to extraforaminal disc protrusion. Mild stenosis of both lateral recesses. The L3 nerve on the left could be affected in the foraminal to extraforaminal region. Facet arthropathy is more pronounced on the right and could be associated with back pain or referred facet syndrome pain.  L4-5: Bilateral facet arthropathy with 1 or 2 mm of anterolisthesis. Bulging of the disc with a left foraminal to extraforaminal disc herniation. Stenosis of both lateral recesses could possibly cause neural compression. The exiting left L4 nerve would likely be affected by the foraminal to extraforaminal disc herniation. The facet arthropathy could be a cause of back pain or referred facet syndrome pain.   Electronically Signed By: Paulina Fusi M.D. On: 01/04/2022 22:12 DG Lumbar Spine Complete  Narrative CLINICAL DATA:  Chronic right back pain  EXAM: LUMBAR SPINE - COMPLETE 4+ VIEW  COMPARISON:  None.  FINDINGS: 5 mm anterolisthesis of L4 upon L5. Otherwise normal lumbar lordosis. Mild lumbar dextroscoliosis, apex right at L2. No acute fracture. Vertebral body height is preserved. There is intervertebral disc space narrowing and endplate remodeling at L3-4 and L4-5 in keeping with changes of mild degenerative disc disease. The paraspinal soft tissues are unremarkable. Intrauterine device overlies the mid pelvis.  IMPRESSION: No acute fracture or listhesis.  Mild degenerative disc disease L3-L5 with grade 1 anterolisthesis at L4-5.  Electronically Signed By: Helyn Numbers M.D. On: 09/29/2021 20:47   Complexity Note: Imaging results reviewed. Results shared with Ms. Slimak, using Layman's terms.                         ROS  Cardiovascular: No reported cardiovascular signs or symptoms such as High blood pressure, coronary artery  disease, abnormal heart rate or rhythm, heart attack, blood thinner therapy or heart weakness and/or failure Pulmonary or Respiratory: Smoking Neurological: No reported neurological signs or symptoms such as seizures, abnormal skin sensations, urinary and/or fecal incontinence, being born with an abnormal open spine and/or a tethered spinal cord Psychological-Psychiatric: No reported psychological or psychiatric signs or symptoms such as difficulty sleeping, anxiety, depression, delusions or hallucinations (schizophrenial), mood swings (bipolar disorders) or suicidal ideations or attempts Gastrointestinal: Reflux or heatburn Genitourinary: No reported renal or genitourinary signs or symptoms such as difficulty voiding or producing urine, peeing blood, non-functioning kidney, kidney stones, difficulty emptying the bladder, difficulty controlling the flow of urine, or chronic kidney disease Hematological: No reported hematological signs or symptoms such as prolonged bleeding, low or poor functioning platelets, bruising or bleeding easily, hereditary bleeding problems, low energy levels due to low hemoglobin or being anemic Endocrine: No reported endocrine signs or symptoms such as high or low blood sugar, rapid heart rate due to high thyroid levels, obesity or weight gain due to slow thyroid or thyroid disease Rheumatologic: No reported rheumatological signs and symptoms such as fatigue, joint pain, tenderness, swelling, redness, heat, stiffness, decreased range of motion, with or without associated rash Musculoskeletal: Negative for myasthenia gravis, muscular dystrophy, multiple sclerosis or malignant hyperthermia Work History: Working full time  Allergies  Ms. Kirgan is allergic to tuberculin, ppd.  Laboratory Chemistry Profile   Renal Lab Results  Component Value Date   BUN 10 01/15/2022   CREATININE 0.63 01/15/2022   BCR NOT APPLICABLE 01/15/2022     Electrolytes Lab Results  Component  Value Date   NA 142 01/15/2022   K 4.0 01/15/2022   CL 106 01/15/2022   CALCIUM 9.6 01/15/2022     Hepatic Lab Results  Component Value Date   AST 17 01/15/2022   ALT 14 01/15/2022     ID Lab Results  Component Value Date   HIV NON-REACTIVE 01/15/2022     Bone No results found for: VD25OH, [id number], YQ6578IO9, GE9528UX3, 25OHVITD1, 25OHVITD2, 25OHVITD3, TESTOFREE, TESTOSTERONE   Endocrine Lab Results  Component Value Date   GLUCOSE 88 01/15/2022   HGBA1C 5.2 01/15/2022     Neuropathy Lab Results  Component Value Date   HGBA1C 5.2 01/15/2022   HIV NON-REACTIVE 01/15/2022     CNS No results found for: COLORCSF, APPEARCSF, RBCCOUNTCSF, WBCCSF, POLYSCSF, LYMPHSCSF, EOSCSF, PROTEINCSF, GLUCCSF, JCVIRUS, CSFOLI, IGGCSF, LABACHR, ACETBL, LABACHR, ACETBL   Inflammation (CRP: Acute  ESR: Chronic) No results found for: CRP, ESRSEDRATE, LATICACIDVEN   Rheumatology No results found for: RF, ANA, LABURIC, URICUR, LYMEIGGIGMAB, LYMEABIGMQN, HLAB27   Coagulation Lab Results  Component Value Date   PLT 310 01/15/2022     Cardiovascular Lab Results  Component Value Date   HGB 13.0 01/15/2022   HCT 39.5 01/15/2022     Screening Lab Results  Component Value Date   HIV NON-REACTIVE 01/15/2022     Cancer No results found for: CEA, CA125, LABCA2   Allergens No results found for: ALMOND, APPLE, ASPARAGUS, AVOCADO, BANANA, BARLEY, BASIL, BAYLEAF, GREENBEAN, LIMABEAN, WHITEBEAN, BEEFIGE, REDBEET, BLUEBERRY, BROCCOLI, CABBAGE, MELON, CARROT,  CASEIN, CASHEWNUT, CAULIFLOWER, CELERY     Note: Lab results reviewed.  PFSH  Drug: Ms. Threet  reports that she does not currently use drugs after having used the following drugs: Marijuana. Alcohol:  reports that she does not currently use alcohol. Tobacco:  reports that she has been smoking cigarettes. She has a 15.00 pack-year smoking history. She has never used smokeless tobacco. Medical:  has a past medical history of  Hyperlipidemia and Hypertension. Family: family history includes Diabetes in her maternal grandmother and mother; Heart attack in her maternal grandfather; Heart attack (age of onset: 56) in her father.  Past Surgical History:  Procedure Laterality Date   CESAREAN SECTION  2014   CHOLECYSTECTOMY  1996   HAND SURGERY  2005   carpal tunnel   INTRAUTERINE DEVICE INSERTION  01/2020   Active Ambulatory Problems    Diagnosis Date Noted   Piriformis syndrome of right side 09/24/2021   Chronic sciatica, right 09/24/2021   Right lumbar radiculitis 10/12/2021   Multilevel lumbosacral spondylosis with radiculopathy 01/10/2022   Chronic right SI joint pain 03/19/2022   Right leg pain 03/19/2022   Resolved Ambulatory Problems    Diagnosis Date Noted   No Resolved Ambulatory Problems   Past Medical History:  Diagnosis Date   Hyperlipidemia    Hypertension    Constitutional Exam  General appearance: Well nourished, well developed, and well hydrated. In no apparent acute distress Vitals:   03/19/22 1255  BP: (!) 142/92  Pulse: (!) 57  Resp: 16  Temp: 97.9 F (36.6 C)  TempSrc: Temporal  SpO2: 100%  Weight: 120 lb (54.4 kg)  Height: 5\' 2"  (1.575 m)   BMI Assessment: Estimated body mass index is 21.95 kg/m as calculated from the following:   Height as of this encounter: 5\' 2"  (1.575 m).   Weight as of this encounter: 120 lb (54.4 kg).  BMI interpretation table: BMI level Category Range association with higher incidence of chronic pain  <18 kg/m2 Underweight   18.5-24.9 kg/m2 Ideal body weight   25-29.9 kg/m2 Overweight Increased incidence by 20%  30-34.9 kg/m2 Obese (Class I) Increased incidence by 68%  35-39.9 kg/m2 Severe obesity (Class II) Increased incidence by 136%  >40 kg/m2 Extreme obesity (Class III) Increased incidence by 254%   Patient's current BMI Ideal Body weight  Body mass index is 21.95 kg/m. Ideal body weight: 50.1 kg (110 lb 7.2 oz) Adjusted ideal body  weight: 51.8 kg (114 lb 4.3 oz)   BMI Readings from Last 4 Encounters:  03/19/22 21.95 kg/m  01/15/22 22.28 kg/m  01/10/22 22.60 kg/m  12/25/21 22.60 kg/m   Wt Readings from Last 4 Encounters:  03/19/22 120 lb (54.4 kg)  01/15/22 125 lb 12.8 oz (57.1 kg)  12/25/21 127 lb 9.6 oz (57.9 kg)  12/24/21 129 lb (58.5 kg)    Psych/Mental status: Alert, oriented x 3 (person, place, & time)       Eyes: PERLA Respiratory: No evidence of acute respiratory distress  Lumbar Spine Area Exam  Skin & Axial Inspection: No masses, redness, or swelling Alignment: Symmetrical Functional ROM: Unrestricted ROM       Stability: No instability detected Muscle Tone/Strength: Functionally intact. No obvious neuro-muscular anomalies detected. Sensory (Neurological): Neurogenic pain pattern Palpation: No palpable anomalies       Provocative Tests: Hyperextension/rotation test: deferred today       Lumbar quadrant test (Kemp's test): deferred today       Lateral bending test: deferred today  Patrick's Maneuver: (+) for right-sided S-I arthralgia             FABER* test: (+) for right-sided S-I arthralgia             S-I anterior distraction/compression test: (+) for right-sided S-I arthralgia             S-I lateral compression test: (+) for right-sided S-I arthralgia             S-I Thigh-thrust test: (+) for right-sided S-I arthralgia             S-I Gaenslen's test: (+) for right-sided S-I arthralgia             *(Flexion, ABduction and External Rotation) Gait & Posture Assessment  Ambulation: Unassisted Gait: Relatively normal for age and body habitus Posture: WNL  Lower Extremity Exam    Side: Right lower extremity  Side: Left lower extremity  Stability: No instability observed          Stability: No instability observed          Skin & Extremity Inspection: Skin color, temperature, and hair growth are WNL. No peripheral edema or cyanosis. No masses, redness, swelling, asymmetry, or  associated skin lesions. No contractures.  Skin & Extremity Inspection: Skin color, temperature, and hair growth are WNL. No peripheral edema or cyanosis. No masses, redness, swelling, asymmetry, or associated skin lesions. No contractures.  Functional ROM: Unrestricted ROM                  Functional ROM: Unrestricted ROM                  Muscle Tone/Strength: Functionally intact. No obvious neuro-muscular anomalies detected.  Muscle Tone/Strength: Functionally intact. No obvious neuro-muscular anomalies detected.  Sensory (Neurological): Neurogenic pain pattern        Sensory (Neurological): Unimpaired        DTR: Patellar: deferred today Achilles: deferred today Plantar: deferred today  DTR: Patellar: deferred today Achilles: deferred today Plantar: deferred today  Palpation: No palpable anomalies  Palpation: No palpable anomalies    Assessment  Primary Diagnosis & Pertinent Problem List: The primary encounter diagnosis was SI (sacroiliac) joint dysfunction. Diagnoses of Chronic right SI joint pain, Piriformis syndrome of right side, and Right leg pain were also pertinent to this visit.  Visit Diagnosis (New problems to examiner): 1. SI (sacroiliac) joint dysfunction   2. Chronic right SI joint pain   3. Piriformis syndrome of right side   4. Right leg pain    Plan of Care (Initial workup plan)   SI joint dysfunction/piriformis syndrome right side: Patient has symptoms and physical exam consistent with SI joint dysfunction, arthropathy and piriformis syndrome.  I explained to her that piriformis syndrome could also compress her sciatic nerve causing radiating pain into her leg.  She she denies any low back pain and states that the majority of her pain starts in her buttock region and radiates distally.  Given positive physical exam findings as noted above, recommend diagnostic right sacroiliac joint injection, right piriformis trigger point injection under fluoroscopy.  Risks and  benefits reviewed and patient would like to proceed.  Differential also includes lumbar radicular pain as the patient has L3-L4 disc bulge with extraforaminal disc protrusion contacting the L3 nerve root in the foraminal extraforaminal region she also has pronounced facet arthropathy at L3-L4 and L4-L5.  Furthermore L4-L5 there is 2 mm of anterolisthesis with left foraminal and extraforaminal disc protrusion causing  mild to moderate compression of the left L4 nerve.  While her MRI shows primarily left-sided foraminal stenosis the majority of the patient's clinical symptoms are all right-sided.  For this reason I recommend working up SI joint and piriformis syndrome via diagnostic injections and then following up with patient to discuss treatment plan thereafter.  I also discussed a TENS unit with her.  Patient endorsed understanding and agreement with plan.  Defer medication management to primary care provider.  Do not recommend chronic opioid therapy for this condition.  Procedure Orders         SACROILIAC JOINT INJECTION         TRIGGER POINT INJECTION      Interventional management options: Ms. Kukla was informed that there is no guarantee that she would be a candidate for interventional therapies. The decision will be based on the results of diagnostic studies, as well as Ms. Oder risk profile.  Procedure(s) under consideration:  Right SI joint injection Right piriformis Lumbar epidural steroid injection, right L3 transforaminal, right L4 transforaminal Diagnostic lumbar facet medial branch nerve blocks bilaterally L3, L4, L5     Provider-requested follow-up: Return in about 2 weeks (around 04/02/2022) for R SI-J + R piriformis, in clinic (PO Valium). 03/19/22  I spent a total of 60 minutes reviewing chart data, face-to-face evaluation with the patient, counseling and coordination of care as detailed above.   No future appointments.  Note by: Edward Jolly, MD Date: 03/19/2022;  Time: 1:52 PM

## 2022-03-28 ENCOUNTER — Other Ambulatory Visit: Payer: Self-pay | Admitting: Family Medicine

## 2022-03-28 DIAGNOSIS — G5701 Lesion of sciatic nerve, right lower limb: Secondary | ICD-10-CM

## 2022-03-28 DIAGNOSIS — M5416 Radiculopathy, lumbar region: Secondary | ICD-10-CM

## 2022-03-28 DIAGNOSIS — M5431 Sciatica, right side: Secondary | ICD-10-CM

## 2022-03-28 NOTE — Telephone Encounter (Signed)
Requested medication (s) are due for refill today - unsure ? ?Requested medication (s) are on the active medication list -yes ? ?Future visit scheduled -no ? ?Last refill: 03/11/22 #42 ? ?Notes to clinic: Request RF: Rx with no RF- sent for review on continuation of Rx ? ?Requested Prescriptions  ?Pending Prescriptions Disp Refills  ? gabapentin (NEURONTIN) 100 MG capsule [Pharmacy Med Name: GABAPENTIN 100MG  CAPSULES] 42 capsule 0  ?  Sig: TAKE 1 TO 3 CAPSULES(100 TO 300 MG) BY MOUTH AT BEDTIME  ?  ? Neurology: Anticonvulsants - gabapentin Passed - 03/28/2022  3:21 AM  ?  ?  Passed - Cr in normal range and within 360 days  ?  Creat  ?Date Value Ref Range Status  ?01/15/2022 0.63 0.50 - 0.99 mg/dL Final  ?  ?  ?  ?  Passed - Completed PHQ-2 or PHQ-9 in the last 360 days  ?  ?  Passed - Valid encounter within last 12 months  ?  Recent Outpatient Visits   ? ?      ? 2 months ago Annual physical exam  ? [place], DO  ? 2 months ago Multilevel lumbosacral spondylosis with radiculopathy  ? [facility] Montel Culver, MD  ? 3 months ago Chronic sciatica, right  ? [place], DO  ? 3 months ago Chronic sciatica, right  ? [facility] Montel Culver, MD  ? 5 months ago Right lumbar radiculitis  ? [facility] Montel Culver, MD  ? ?  ?  ? ? ?  ?  ?  ? ? ? ?Requested Prescriptions  ?Pending Prescriptions Disp Refills  ? gabapentin (NEURONTIN) 100 MG capsule [Pharmacy Med Name: GABAPENTIN 100MG  CAPSULES] 42 capsule 0  ?  Sig: TAKE 1 TO 3 CAPSULES(100 TO 300 MG) BY MOUTH AT BEDTIME  ?  ? Neurology: Anticonvulsants - gabapentin Passed - 03/28/2022  3:21 AM  ?  ?  Passed - Cr in normal range and within 360 days  ?  Creat  ?Date Value Ref Range Status  ?01/15/2022 0.63 0.50 - 0.99 mg/dL Final  ?  ?  ?  ?  Passed - Completed PHQ-2 or PHQ-9 in the last 360 days  ?  ?  Passed - Valid encounter within last 12 months   ?  Recent Outpatient Visits   ? ?      ? 2 months ago Annual physical exam  ? [place], DO  ? 2 months ago Multilevel lumbosacral spondylosis with radiculopathy  ? [facility] Montel Culver, MD  ? 3 months ago Chronic sciatica, right  ? [place], DO  ? 3 months ago Chronic sciatica, right  ? [facility] Montel Culver, MD  ? 5 months ago Right lumbar radiculitis  ? [facility] Montel Culver, MD  ? ?  ?  ? ? ?  ?  ?  ? ? ? ?

## 2022-04-01 ENCOUNTER — Ambulatory Visit (HOSPITAL_BASED_OUTPATIENT_CLINIC_OR_DEPARTMENT_OTHER): Payer: BC Managed Care – PPO | Admitting: Student in an Organized Health Care Education/Training Program

## 2022-04-01 ENCOUNTER — Other Ambulatory Visit: Payer: Self-pay

## 2022-04-01 ENCOUNTER — Ambulatory Visit
Admission: RE | Admit: 2022-04-01 | Discharge: 2022-04-01 | Disposition: A | Discharge status: Home / Self Care | Payer: BC Managed Care – PPO | Source: Ambulatory Visit | Attending: Student in an Organized Health Care Education/Training Program | Admitting: Student in an Organized Health Care Education/Training Program

## 2022-04-01 ENCOUNTER — Encounter: Payer: Self-pay | Admitting: Student in an Organized Health Care Education/Training Program

## 2022-04-01 VITALS — BP 139/82 | HR 76 | Temp 97.9°F | Resp 16 | Ht 62.0 in | Wt 120.0 lb

## 2022-04-01 DIAGNOSIS — M533 Sacrococcygeal disorders, not elsewhere classified: Secondary | ICD-10-CM | POA: Diagnosis not present

## 2022-04-01 DIAGNOSIS — G8929 Other chronic pain: Secondary | ICD-10-CM

## 2022-04-01 DIAGNOSIS — M549 Dorsalgia, unspecified: Secondary | ICD-10-CM | POA: Insufficient documentation

## 2022-04-01 DIAGNOSIS — Z7982 Long term (current) use of aspirin: Secondary | ICD-10-CM | POA: Insufficient documentation

## 2022-04-01 DIAGNOSIS — G5701 Lesion of sciatic nerve, right lower limb: Secondary | ICD-10-CM | POA: Insufficient documentation

## 2022-04-01 DIAGNOSIS — M2559 Pain in other specified joint: Secondary | ICD-10-CM | POA: Diagnosis not present

## 2022-04-01 MED ORDER — LIDOCAINE HCL 2 % IJ SOLN
20.0000 mL | Freq: Once | INTRAMUSCULAR | Status: AC
  Administered 2022-04-01: 400 mg

## 2022-04-01 MED ORDER — METHYLPREDNISOLONE ACETATE 40 MG/ML IJ SUSP
INTRAMUSCULAR | Status: AC
  Filled 2022-04-01: qty 1

## 2022-04-01 MED ORDER — METHYLPREDNISOLONE ACETATE 40 MG/ML IJ SUSP
40.0000 mg | Freq: Once | INTRAMUSCULAR | Status: AC
  Administered 2022-04-01: 40 mg via INTRA_ARTICULAR

## 2022-04-01 MED ORDER — ROPIVACAINE HCL 2 MG/ML IJ SOLN
INTRAMUSCULAR | Status: AC
Start: 2022-04-01 — End: ?
  Filled 2022-04-01: qty 20

## 2022-04-01 MED ORDER — DEXAMETHASONE SODIUM PHOSPHATE 10 MG/ML IJ SOLN
10.0000 mg | Freq: Once | INTRAMUSCULAR | Status: AC
  Administered 2022-04-01: 10 mg

## 2022-04-01 MED ORDER — DIAZEPAM 5 MG PO TABS
ORAL_TABLET | ORAL | Status: AC
  Filled 2022-04-01: qty 1

## 2022-04-01 MED ORDER — LIDOCAINE HCL 2 % IJ SOLN
INTRAMUSCULAR | Status: AC
  Filled 2022-04-01: qty 20

## 2022-04-01 MED ORDER — DEXAMETHASONE SODIUM PHOSPHATE 10 MG/ML IJ SOLN
INTRAMUSCULAR | Status: AC
  Filled 2022-04-01: qty 1

## 2022-04-01 MED ORDER — IOHEXOL 180 MG/ML  SOLN
10.0000 mL | Freq: Once | INTRAMUSCULAR | Status: AC
  Administered 2022-04-01: 10 mL via INTRA_ARTICULAR
  Filled 2022-04-01: qty 20

## 2022-04-01 MED ORDER — ROPIVACAINE HCL 2 MG/ML IJ SOLN
9.0000 mL | Freq: Once | INTRAMUSCULAR | Status: AC
  Administered 2022-04-01: 9 mL via PERINEURAL

## 2022-04-01 NOTE — Patient Instructions (Signed)

## 2022-04-01 NOTE — Progress Notes (Signed)
PROVIDER NOTE: Interpretation of information contained herein should be left to medically-trained personnel. Specific patient instructions are provided elsewhere under "Patient Instructions" section of medical record. This document was created in part using STT-dictation technology, any transcriptional errors that may result from this process are unintentional.  ?Patient: Sonia Eaton ?Type: Established ?DOB: November 02, 1976 ?MRN: [id number] ?PCP: Smitty Cords, DO  Service: Procedure ?DOS: 04/01/2022 ?Setting: Ambulatory ?Location: Ambulatory outpatient facility ?Delivery: Face-to-face Provider: Edward Jolly, MD ?Specialty: Interventional Pain Management ?Specialty designation: 09 ?Location: Outpatient facility ?Ref. Prov.: Saralyn Pilar *   ? ?Primary Reason for Visit: Interventional Pain Management Treatment. ?CC: Back Pain (Right and piriformis ) ? ?  ?Procedure:          Anesthesia, Analgesia, Anxiolysis:  ?Type: Diagnostic Sacroiliac Joint Steroid Injection  and right Piriformis TPI #1  ?Region: Inferior Lumbosacral Region ?Level: PIIS (Posterior Inferior Iliac Spine) ?Laterality: Right-Side  Anesthesia: Local (1-2% Lidocaine)  ?Anxiolysis: Oral Valium 5 mg PO  ?Sedation: Minimal  ?Guidance: Fluoroscopy         ? ? ?Position: Prone          ? ?1. SI (sacroiliac) joint dysfunction   ?2. Chronic right SI joint pain   ?3. Piriformis syndrome of right side   ? ?NAS-11 Pain score:  ? Pre-procedure: 3 /10  ? Post-procedure: 0-No pain/10  ? ?  ?Pre-op H&P Assessment:  ?[name] is a 46 y.o. (year old), female patient, seen today for interventional treatment. She  has a past surgical history that includes Cholecystectomy (1996); Hand surgery (2005); Cesarean section (2014); and Intrauterine device insertion (01/2020). [name] has a current medication list which includes the following prescription(s): aspirin-salicylamide-caffeine, baclofen, esomeprazole, gabapentin, and meloxicam. Her primarily  concern today is the Back Pain (Right and piriformis ) ? ?Initial Vital Signs:  ?Pulse/HCG Rate: 62  ?Temp: 97.9 ?F (36.6 ?C) ?Resp: 16 ?BP: 124/89 ?SpO2: 100 % ? ?BMI: Estimated body mass index is 21.95 kg/m? as calculated from the following: ?  Height as of this encounter: 5\' 2"  (1.575 m). ?  Weight as of this encounter: 120 lb (54.4 kg). ? ?Risk Assessment: ?Allergies: Reviewed. She is allergic to tuberculin, ppd.  ?Allergy Precautions: None required ?Coagulopathies: Reviewed. None identified.  ?Blood-thinner therapy: None at this time ?Active Infection(s): Reviewed. None identified. [name] is afebrile ? ?Site Confirmation: [name] was asked to confirm the procedure and laterality before marking the site ?Procedure checklist: Completed ?Consent: Before the procedure and under the influence of no sedative(s), amnesic(s), or anxiolytics, the patient was informed of the treatment options, risks and possible complications. To fulfill our ethical and legal obligations, as recommended by the American Medical Association's Code of Ethics, I have informed the patient of my clinical impression; the nature and purpose of the treatment or procedure; the risks, benefits, and possible complications of the intervention; the alternatives, including doing nothing; the risk(s) and benefit(s) of the alternative treatment(s) or procedure(s); and the risk(s) and benefit(s) of doing nothing. ?The patient was provided information about the general risks and possible complications associated with the procedure. These may include, but are not limited to: failure to achieve desired goals, infection, bleeding, organ or nerve damage, allergic reactions, paralysis, and death. ?In addition, the patient was informed of those risks and complications associated to the procedure, such as failure to decrease pain; infection; bleeding; organ or nerve damage with subsequent damage to sensory, motor, and/or autonomic systems, resulting in  permanent pain, numbness, and/or weakness of one or several  areas of the body; allergic reactions; (i.e.: anaphylactic reaction); and/or death. ?Furthermore, the patient was informed of those risks and complications associated with the medications. These include, but are not limited to: allergic reactions (i.e.: anaphylactic or anaphylactoid reaction(s)); adrenal axis suppression; blood sugar elevation that in diabetics may result in ketoacidosis or comma; water retention that in patients with history of congestive heart failure may result in shortness of breath, pulmonary edema, and decompensation with resultant heart failure; weight gain; swelling or edema; medication-induced neural toxicity; particulate matter embolism and blood vessel occlusion with resultant organ, and/or nervous system infarction; and/or aseptic necrosis of one or more joints. ?Finally, the patient was informed that Medicine is not an exact science; therefore, there is also the possibility of unforeseen or unpredictable risks and/or possible complications that may result in a catastrophic outcome. The patient indicated having understood very clearly. We have given the patient no guarantees and we have made no promises. Enough time was given to the patient to ask questions, all of which were answered to the patient's satisfaction. [name] has indicated that she wanted to continue with the procedure. ?Attestation: I, the ordering provider, attest that I have discussed with the patient the benefits, risks, side-effects, alternatives, likelihood of achieving goals, and potential problems during recovery for the procedure that I have provided informed consent. ?Date  Time: 04/01/2022  1:18 PM ? ?Pre-Procedure Preparation:  ?Monitoring: As per clinic protocol. Respiration, ETCO2, SpO2, BP, heart rate and rhythm monitor placed and checked for adequate function ?Safety Precautions: Patient was assessed for positional comfort and pressure points  before starting the procedure. ?Time-out: I initiated and conducted the "Time-out" before starting the procedure, as per protocol. The patient was asked to participate by confirming the accuracy of the "Time Out" information. Verification of the correct person, site, and procedure were performed and confirmed by me, the nursing staff, and the patient. "Time-out" conducted as per Joint Commission's Universal Protocol (UP.01.01.01). ?Time: 1354 ? ?Description of Procedure:          ?Target Area: Inferior, posterior, aspect of the sacroiliac fissure ?Approach: Posterior, paraspinal, ipsilateral approach. ?Area Prepped: Entire Lower Lumbosacral Region ?DuraPrep (Iodine Povacrylex [0.7% available iodine] and Isopropyl Alcohol, 74% w/w) ?Safety Precautions: Aspiration looking for blood return was conducted prior to all injections. At no point did we inject any substances, as a needle was being advanced. No attempts were made at seeking any paresthesias. Safe injection practices and needle disposal techniques used. Medications properly checked for expiration dates. SDV (single dose vial) medications used. ?Description of the Procedure: Protocol guidelines were followed. The patient was placed in position over the procedure table. The target area was identified and the area prepped in the usual manner. Skin & deeper tissues infiltrated with local anesthetic. Appropriate amount of time allowed to pass for local anesthetics to take effect. The procedure needle was advanced under fluoroscopic guidance into the sacroiliac joint until a firm endpoint was obtained. Proper needle placement secured. Negative aspiration confirmed. Solution injected in intermittent fashion, asking for systemic symptoms every 0.5cc of injectate. The needles were then removed and the area cleansed, making sure to leave some of the prepping solution back to take advantage of its long term bactericidal properties. ?Vitals:  ? 04/01/22 1330 04/01/22 1350  04/01/22 1400  ?BP: 124/89 (!) 149/91 139/82  ?Pulse: 62 78 76  ?Resp: 16 14 16   ?Temp: 97.9 ?F (36.6 ?C)    ?TempSrc: Temporal    ?SpO2: 100% 100% 99%  ?Weight: 120  lb (54.4 kg)    ?Height:  (1.575 m)    ?  ?

## 2022-04-01 NOTE — Progress Notes (Signed)
Safety precautions to be maintained throughout the outpatient stay will include: orient to surroundings, keep bed in low position, maintain call bell within reach at all times, provide assistance with transfer out of bed and ambulation.  

## 2022-04-22 ENCOUNTER — Other Ambulatory Visit: Payer: Self-pay | Admitting: Family Medicine

## 2022-04-22 DIAGNOSIS — M5431 Sciatica, right side: Secondary | ICD-10-CM

## 2022-04-22 DIAGNOSIS — G5701 Lesion of sciatic nerve, right lower limb: Secondary | ICD-10-CM

## 2022-04-23 NOTE — Telephone Encounter (Signed)
Requested Prescriptions  Pending Prescriptions Disp Refills  . baclofen (LIORESAL) 10 MG tablet [Pharmacy Med Name: BACLOFEN 10MG TABLETS] 42 tablet 0    Sig: TAKE 1/2 TO 1 TABLET(5 TO 10 MG) BY MOUTH THREE TIMES DAILY AS NEEDED FOR MUSCLE SPASMS     Analgesics:  Muscle Relaxants - baclofen Passed - 04/22/2022 11:31 AM      Passed - Cr in normal range and within 180 days    Creat  Date Value Ref Range Status  01/15/2022 0.63 0.50 - 0.99 mg/dL Final         Passed - eGFR is 30 or above and within 180 days    eGFR  Date Value Ref Range Status  01/15/2022 111 > OR = 60 mL/min/1.65m Final    Comment:    The eGFR is based on the CKD-EPI 2021 equation. To calculate  the new eGFR from a previous Creatinine or Cystatin C result, go to https://www.kidney.org/professionals/ kdoqi/gfr%5Fcalculator          Passed - Valid encounter within last 6 months    Recent Outpatient Visits          3 months ago Annual physical exam   [place]   3 months ago Multilevel lumbosacral spondylosis [facility] rad[facility]   3 months ago Chronic sciatica, right   [place]   4 months [facility], [facility]   6 months ago [facility] lumbar radiculitis   [facility]

## 2022-05-02 ENCOUNTER — Encounter: Payer: Self-pay | Admitting: Student in an Organized Health Care Education/Training Program

## 2022-05-06 ENCOUNTER — Ambulatory Visit
Payer: BC Managed Care – PPO | Attending: Student in an Organized Health Care Education/Training Program | Admitting: Student in an Organized Health Care Education/Training Program

## 2022-05-06 ENCOUNTER — Encounter: Payer: Self-pay | Admitting: Student in an Organized Health Care Education/Training Program

## 2022-05-06 DIAGNOSIS — M533 Sacrococcygeal disorders, not elsewhere classified: Secondary | ICD-10-CM | POA: Diagnosis not present

## 2022-05-06 DIAGNOSIS — G8929 Other chronic pain: Secondary | ICD-10-CM | POA: Diagnosis not present

## 2022-05-06 DIAGNOSIS — M79604 Pain in right leg: Secondary | ICD-10-CM | POA: Diagnosis not present

## 2022-05-06 DIAGNOSIS — G5701 Lesion of sciatic nerve, right lower limb: Secondary | ICD-10-CM | POA: Diagnosis not present

## 2022-05-06 NOTE — Progress Notes (Signed)
Patient: Sonia Eaton  Service Category: E/M  Provider: Gillis Santa, MD  DOB: [birth year]  DOS: 05/06/2022  Location: Office  MRN: [id number]  Setting: Ambulatory outpatient  Referring Provider: Nobie Putnam *  Type: Established Patient  Specialty: Interventional Pain Management  PCP: Olin Hauser, DO  Location: Remote location  Delivery: TeleHealth     Virtual Encounter - Pain Management PROVIDER NOTE: Information contained herein reflects review and annotations entered in association with encounter. Interpretation of such information and data should be left to medically-trained personnel. Information provided to patient can be located elsewhere in the medical record under "Patient Instructions". Document created using STT-dictation technology, any transcriptional errors that may result from process are unintentional.    Contact & Pharmacy Preferred: [phone] Home: [phone] (home) Mobile: [phone] (mobile) E-mail: [email or web address]  [facility] [address] [place] [address] Phone: [phone] Fax: [phone]   Pre-screening  [name] offered "in-person" vs "virtual" encounter. She indicated preferring virtual for this encounter.   Reason COVID-19*  Social distancing based on CDC and AMA recommendations.   I contacted [name] on 05/06/2022 via telephone.      I clearly identified myself as Gillis Santa, MD. I verified that I was speaking with the correct person using two identifiers (Name: Sonia Eaton, and date of birth: December 16, 1975).  Consent I sought verbal advanced consent from [name] for virtual visit interactions. I informed [name] of possible security and privacy concerns, risks, and limitations associated with providing "not-in-person" medical evaluation and management services. I also informed [name] of the availability of  "in-person" appointments. Finally, I informed her that there would be a charge for the virtual visit and that she could be  personally, fully or partially, financially responsible for it. Ms. Hush expressed understanding and agreed to proceed.   Historic Elements   [name] is a 46 y.o. year old, female patient evaluated today after our last contact on 04/01/2022. [name]  has a past medical history of Hyperlipidemia and Hypertension. She also  has a past surgical history that includes Cholecystectomy (1996); Hand surgery (2005); Cesarean section (2014); and Intrauterine device insertion (01/2020). [name] has a current medication list which includes the following prescription(s): aspirin-salicylamide-caffeine, baclofen, esomeprazole, gabapentin, meloxicam, and buprenorphine hcl-naloxone hcl. She  reports that she has been smoking cigarettes. She has a 15.00 pack-year smoking history. She has never used smokeless tobacco. She reports that she does not currently use alcohol. She reports that she does not currently use drugs after having used the following drugs: Marijuana. [name] is allergic to tuberculin, ppd.   HPI  Today, she is being contacted for a post-procedure assessment.   Post-procedure evaluation    Procedure:          Anesthesia, Analgesia, Anxiolysis:  Type: Diagnostic Sacroiliac Joint Steroid Injection  and right Piriformis TPI #1  Region: Inferior Lumbosacral Region Level: PIIS (Posterior Inferior Iliac Spine) Laterality: Right-Side  Anesthesia: Local (1-2% Lidocaine)  Anxiolysis: Oral Valium 5 mg PO  Sedation: Minimal  Guidance: Fluoroscopy           Position: Prone           1. SI (sacroiliac) joint dysfunction   2. Chronic right SI joint pain   3. Piriformis syndrome of right side    NAS-11 Pain score:   Pre-procedure: 3 /10   Post-procedure: 0-No pain/10  Effectiveness:  Initial hour after procedure: 100 %  Subsequent 4-6 hours  post-procedure: 80 %  Analgesia past initial 6 hours: 80% for 1 week then pain returned Ongoing improvement:  Analgesic: 40-50% Function: Back to baseline ROM: Back to baseline   Laboratory Chemistry Profile   Renal Lab Results  Component Value Date   BUN 10 01/15/2022   CREATININE 0.63 05/31/1600   BCR NOT APPLICABLE 09/32/3557    Hepatic Lab Results  Component Value Date   AST 17 01/15/2022   ALT 14 01/15/2022    Electrolytes Lab Results  Component Value Date   NA 142 01/15/2022   K 4.0 01/15/2022   CL 106 01/15/2022   CALCIUM 9.6 01/15/2022    Bone No results found for: VD25OH, VD125OH2TOT, DU2025KY7, CW2376EG3, 25OHVITD1, 25OHVITD2, 25OHVITD3, TESTOFREE, TESTOSTERONE  Inflammation (CRP: Acute Phase) (ESR: Chronic Phase) No results found for: CRP, ESRSEDRATE, LATICACIDVEN       Note: Above Lab results reviewed.   Assessment  The primary encounter diagnosis was SI (sacroiliac) joint dysfunction. Diagnoses of Chronic right SI joint pain, Piriformis syndrome of right side, and Right leg pain were also pertinent to this visit.  Plan of Care   Good response to initial series of injections however patient states that pain is now returning.  We discussed repeating right SI joint and right piriformis injection given diagnostic data above.  Risks and benefits reviewed and patient would like to proceed.  Orders:  Orders Placed This Encounter  Procedures   SACROILIAC JOINT INJECTION    Standing Status:   Future    Standing Expiration Date:   06/05/2022    Scheduling Instructions:     Side: RIGHT     Sedation: Patient's choice.     Timeframe: ASAP    Order Specific Question:   Where will this procedure be performed?    Answer:   [facility]   TRIGGER POINT INJECTION    Area: Buttocks region (gluteal area) Indications: Piriformis muscle pain; Right (G57.01) piriformis-syndrome; piriformis muscle spasms (T51.761). CPT code: 20552    Standing Status:   Future     Standing Expiration Date:   05/07/2023    Scheduling Instructions:     Type: Myoneural block (TPI) of piriformis muscle.     Side:  RIGHT     Sedation: PO Valium     Timeframe: Today    Order Specific Question:   Where will this procedure be performed?    Answer:   [facility]   Follow-up plan:   Return in about 2 weeks (around 05/20/2022) for R SI-J + R Piriformis, in clinic (PO Valium).     R SIJ + R Piriformis    Recent Visits Date Type Provider Dept  04/01/22 Procedure visit Gillis Santa, MD [facility]  03/19/22 Office Visit Gillis Santa, MD [facility]  Showing recent visits within past 90 days and meeting all other requirements Today's Visits Date Type Provider Dept  05/06/22 Office Visit Gillis Santa, MD [facility]  Showing today's visits and meeting all other requirements Future Appointments No visits were found meeting these conditions. Showing future appointments within next 90 days and meeting all other requirements  I discussed the assessment and treatment plan with the patient. The patient was provided an opportunity to ask questions and all were answered. The patient agreed with the plan and demonstrated an understanding of the instructions.  Patient advised to call back or seek an in-person evaluation if the symptoms  or condition worsens.  Duration of encounter: 30 minutes.  Note by: Gillis Santa, MD Date: 05/06/2022; Time: 2:55 PM

## 2022-06-12 ENCOUNTER — Ambulatory Visit
Payer: BC Managed Care – PPO | Attending: Student in an Organized Health Care Education/Training Program | Admitting: Student in an Organized Health Care Education/Training Program

## 2022-06-12 ENCOUNTER — Ambulatory Visit
Admission: RE | Admit: 2022-06-12 | Discharge: 2022-06-12 | Disposition: A | Discharge status: Home / Self Care | Payer: BC Managed Care – PPO | Source: Ambulatory Visit | Attending: Student in an Organized Health Care Education/Training Program | Admitting: Student in an Organized Health Care Education/Training Program

## 2022-06-12 ENCOUNTER — Encounter: Payer: Self-pay | Admitting: Student in an Organized Health Care Education/Training Program

## 2022-06-12 VITALS — BP 136/95 | HR 57 | Temp 99.0°F | Resp 18 | Ht 62.0 in | Wt 120.0 lb

## 2022-06-12 DIAGNOSIS — Z9049 Acquired absence of other specified parts of digestive tract: Secondary | ICD-10-CM | POA: Diagnosis not present

## 2022-06-12 DIAGNOSIS — G5701 Lesion of sciatic nerve, right lower limb: Secondary | ICD-10-CM | POA: Insufficient documentation

## 2022-06-12 DIAGNOSIS — G8929 Other chronic pain: Secondary | ICD-10-CM | POA: Diagnosis not present

## 2022-06-12 DIAGNOSIS — M549 Dorsalgia, unspecified: Secondary | ICD-10-CM | POA: Diagnosis not present

## 2022-06-12 DIAGNOSIS — M79604 Pain in right leg: Secondary | ICD-10-CM | POA: Insufficient documentation

## 2022-06-12 DIAGNOSIS — M533 Sacrococcygeal disorders, not elsewhere classified: Secondary | ICD-10-CM | POA: Insufficient documentation

## 2022-06-12 MED ORDER — DIAZEPAM 5 MG PO TABS
ORAL_TABLET | ORAL | Status: AC
Start: 2022-06-12 — End: ?
  Filled 2022-06-12: qty 1

## 2022-06-12 MED ORDER — DEXAMETHASONE SODIUM PHOSPHATE 10 MG/ML IJ SOLN
10.0000 mg | Freq: Once | INTRAMUSCULAR | Status: AC
  Administered 2022-06-12: 10 mg
  Filled 2022-06-12: qty 1

## 2022-06-12 MED ORDER — ROPIVACAINE HCL 2 MG/ML IJ SOLN
9.0000 mL | Freq: Once | INTRAMUSCULAR | Status: AC
  Administered 2022-06-12: 9 mL via INTRA_ARTICULAR
  Filled 2022-06-12: qty 20

## 2022-06-12 MED ORDER — LIDOCAINE HCL 2 % IJ SOLN
20.0000 mL | Freq: Once | INTRAMUSCULAR | Status: AC
  Administered 2022-06-12: 400 mg
  Filled 2022-06-12: qty 40

## 2022-06-12 MED ORDER — DIAZEPAM 5 MG PO TABS
5.0000 mg | ORAL_TABLET | ORAL | Status: AC
Start: 2022-06-12 — End: 2022-06-12
  Administered 2022-06-12: 5 mg via ORAL

## 2022-06-12 MED ORDER — IOHEXOL 180 MG/ML  SOLN
10.0000 mL | Freq: Once | INTRAMUSCULAR | Status: AC
Start: 2022-06-12 — End: 2022-06-12
  Administered 2022-06-12: 10 mL via INTRA_ARTICULAR
  Filled 2022-06-12: qty 20

## 2022-06-12 MED ORDER — METHYLPREDNISOLONE ACETATE 40 MG/ML IJ SUSP
40.0000 mg | Freq: Once | INTRAMUSCULAR | Status: AC
  Administered 2022-06-12: 40 mg via INTRA_ARTICULAR
  Filled 2022-06-12: qty 1

## 2022-06-12 NOTE — Progress Notes (Signed)
PROVIDER NOTE: Interpretation of information contained herein should be left to medically-trained personnel. Specific patient instructions are provided elsewhere under "Patient Instructions" section of medical record. This document was created in part using STT-dictation technology, any transcriptional errors that may result from this process are unintentional.  Patient: Sonia Eaton Type: Established DOB: 1976-01-12 MRN: [id number] PCP: Smitty Cords, DO  Service: Procedure DOS: 06/12/2022 Setting: Ambulatory Location: Ambulatory outpatient facility Delivery: Face-to-face Provider: Edward Jolly, MD Specialty: Interventional Pain Management Specialty designation: 09 Location: Outpatient facility Ref. Prov.: Edward Jolly, MD    Primary Reason for Visit: Interventional Pain Management Treatment. CC: Back Pain (Right, lower)    Procedure:          Anesthesia, Analgesia, Anxiolysis:  Type: Therapeutic Sacroiliac Joint Steroid Injection  and right Piriformis TPI #1  Region: Inferior Lumbosacral Region Level: PIIS (Posterior Inferior Iliac Spine) Laterality: Right-Side  Anesthesia: Local (1-2% Lidocaine)  Anxiolysis: Oral Valium 5 mg PO  Sedation: Minimal  Guidance: Fluoroscopy           Position: Prone           1. SI (sacroiliac) joint dysfunction   2. Chronic right SI joint pain   3. Piriformis syndrome of right side   4. Right leg pain    NAS-11 Pain score:   Pre-procedure: 3 /10   Post-procedure: 0-No pain/10     Pre-op H&P Assessment:  Sonia Eaton is a 46 y.o. (year old), female patient, seen today for interventional treatment. She  has a past surgical history that includes Cholecystectomy (1996); Hand surgery (2005); Cesarean section (2014); and Intrauterine device insertion (01/2020). Sonia Eaton has a current medication list which includes the following prescription(s): aspirin-salicylamide-caffeine, baclofen, esomeprazole, gabapentin, meloxicam, and buprenorphine  hcl-naloxone hcl. Her primarily concern today is the Back Pain (Right, lower)  Initial Vital Signs:  Pulse/HCG Rate: (!) 57ECG Heart Rate: 67 Temp: 99 F (37.2 C) Resp: 14 BP: 132/88 SpO2: 100 %  BMI: Estimated body mass index is 21.95 kg/m as calculated from the following:   Height as of this encounter: 5\' 2"  (1.575 m).   Weight as of this encounter: 120 lb (54.4 kg).  Risk Assessment: Allergies: Reviewed. She is allergic to tuberculin, ppd.  Allergy Precautions: None required Coagulopathies: Reviewed. None identified.  Blood-thinner therapy: None at this time Active Infection(s): Reviewed. None identified. Sonia Eaton is afebrile  Site Confirmation: Sonia Eaton was asked to confirm the procedure and laterality before marking the site Procedure checklist: Completed Consent: Before the procedure and under the influence of no sedative(s), amnesic(s), or anxiolytics, the patient was informed of the treatment options, risks and possible complications. To fulfill our ethical and legal obligations, as recommended by the American Medical Association's Code of Ethics, I have informed the patient of my clinical impression; the nature and purpose of the treatment or procedure; the risks, benefits, and possible complications of the intervention; the alternatives, including doing nothing; the risk(s) and benefit(s) of the alternative treatment(s) or procedure(s); and the risk(s) and benefit(s) of doing nothing. The patient was provided information about the general risks and possible complications associated with the procedure. These may include, but are not limited to: failure to achieve desired goals, infection, bleeding, organ or nerve damage, allergic reactions, paralysis, and death. In addition, the patient was informed of those risks and complications associated to the procedure, such as failure to decrease pain; infection; bleeding; organ or nerve damage with subsequent damage to sensory, motor,  and/or autonomic systems, resulting in permanent  pain, numbness, and/or weakness of one or several areas of the body; allergic reactions; (i.e.: anaphylactic reaction); and/or death. Furthermore, the patient was informed of those risks and complications associated with the medications. These include, but are not limited to: allergic reactions (i.e.: anaphylactic or anaphylactoid reaction(s)); adrenal axis suppression; blood sugar elevation that in diabetics may result in ketoacidosis or comma; water retention that in patients with history of congestive heart failure may result in shortness of breath, pulmonary edema, and decompensation with resultant heart failure; weight gain; swelling or edema; medication-induced neural toxicity; particulate matter embolism and blood vessel occlusion with resultant organ, and/or nervous system infarction; and/or aseptic necrosis of one or more joints. Finally, the patient was informed that Medicine is not an exact science; therefore, there is also the possibility of unforeseen or unpredictable risks and/or possible complications that may result in a catastrophic outcome. The patient indicated having understood very clearly. We have given the patient no guarantees and we have made no promises. Enough time was given to the patient to ask questions, all of which were answered to the patient's satisfaction. Sonia Eaton has indicated that she wanted to continue with the procedure. Attestation: I, the ordering provider, attest that I have discussed with the patient the benefits, risks, side-effects, alternatives, likelihood of achieving goals, and potential problems during recovery for the procedure that I have provided informed consent. Date  Time: 06/12/2022 10:06 AM  Pre-Procedure Preparation:  Monitoring: As per clinic protocol. Respiration, ETCO2, SpO2, BP, heart rate and rhythm monitor placed and checked for adequate function Safety Precautions: Patient was assessed for  positional comfort and pressure points before starting the procedure. Time-out: I initiated and conducted the "Time-out" before starting the procedure, as per protocol. The patient was asked to participate by confirming the accuracy of the "Time Out" information. Verification of the correct person, site, and procedure were performed and confirmed by me, the nursing staff, and the patient. "Time-out" conducted as per Joint Commission's Universal Protocol (UP.01.01.01). Time: 1100  Description of Procedure:          Target Area: Inferior, posterior, aspect of the sacroiliac fissure Approach: Posterior, paraspinal, ipsilateral approach. Area Prepped: Entire Lower Lumbosacral Region DuraPrep (Iodine Povacrylex [0.7% available iodine] and Isopropyl Alcohol, 74% w/w) Safety Precautions: Aspiration looking for blood return was conducted prior to all injections. At no point did we inject any substances, as a needle was being advanced. No attempts were made at seeking any paresthesias. Safe injection practices and needle disposal techniques used. Medications properly checked for expiration dates. SDV (single dose vial) medications used. Description of the Procedure: Protocol guidelines were followed. The patient was placed in position over the procedure table. The target area was identified and the area prepped in the usual manner. Skin & deeper tissues infiltrated with local anesthetic. Appropriate amount of time allowed to pass for local anesthetics to take effect. The procedure needle was advanced under fluoroscopic guidance into the sacroiliac joint until a firm endpoint was obtained. Proper needle placement secured. Negative aspiration confirmed. Solution injected in intermittent fashion, asking for systemic symptoms every 0.5cc of injectate. The needles were then removed and the area cleansed, making sure to leave some of the prepping solution back to take advantage of its long term bactericidal  properties. Vitals:   06/12/22 1017 06/12/22 1059 06/12/22 1104 06/12/22 1107  BP: 132/88 128/87 (!) 146/105 (!) 136/95  Pulse: (!) 57     Resp: 14 18 18 18   Temp: 99 F (37.2 C)  TempSrc: Temporal     SpO2: 100% 98% 100% 100%  Weight: 120 lb (54.4 kg)     Height: 5\' 2"  (1.575 m)       Start Time: 1100 hrs. End Time: 1106 hrs. Materials:  Needle(s) Type: Spinal Needle Gauge: 25G Length: 3.5-in Medication(s): Please see orders for medications and dosing details.  6cc solution made of 5cc of 0.2% ropivacaine, 1 cc of methylprednisolone, 40 mg/cc.  Injected into the right SI joint after contrast confirmation.  Afterwards a right piriformis trigger point injection was done 1 cm inferior, 1 cm deep, 1 cm lateral to the inferior fissure of the SI joint.  Contrast was injected to confirm piriformis muscle striation.  While injecting, patient did not complain of any pain radiating down her leg. 6cc solution made of 5cc of 0.2% ropivacaine, 1 cc of Decadron 10 mg/cc was injected into the right piriformis after contrast confirmation.    Imaging Guidance (Non-Spinal):          Type of Imaging Technique: Fluoroscopy Guidance (Non-Spinal) Indication(s): Assistance in needle guidance and placement for procedures requiring needle placement in or near specific anatomical locations not easily accessible without such assistance. Exposure Time: Please see nurses notes. Contrast: Before injecting any contrast, we confirmed that the patient did not have an allergy to iodine, shellfish, or radiological contrast. Once satisfactory needle placement was completed at the desired level, radiological contrast was injected. Contrast injected under live fluoroscopy. No contrast complications. See chart for type and volume of contrast used. Fluoroscopic Guidance: I was personally present during the use of fluoroscopy. "Tunnel Vision Technique" used to obtain the best possible view of the target area. Parallax  error corrected before commencing the procedure. "Direction-depth-direction" technique used to introduce the needle under continuous pulsed fluoroscopy. Once target was reached, antero-posterior, oblique, and lateral fluoroscopic projection used confirm needle placement in all planes. Images permanently stored in EMR. Interpretation: I personally interpreted the imaging intraoperatively. Adequate needle placement confirmed in multiple planes. Appropriate spread of contrast into desired area was observed. No evidence of afferent or efferent intravascular uptake. Permanent images saved into the patient's record.  Antibiotic Prophylaxis:   Anti-infectives (From admission, onward)    None      Indication(s): None identified  Post-operative Assessment:  Post-procedure Vital Signs:  Pulse/HCG Rate: (!) 5761 Temp: 99 F (37.2 C) Resp: 18 BP: (!) 136/95 SpO2: 100 %  EBL: None  Complications: No immediate post-treatment complications observed by team, or reported by patient.  Note: The patient tolerated the entire procedure well. A repeat set of vitals were taken after the procedure and the patient was kept under observation following institutional policy, for this type of procedure. Post-procedural neurological assessment was performed, showing return to baseline, prior to discharge. The patient was provided with post-procedure discharge instructions, including a section on how to identify potential problems. Should any problems arise concerning this procedure, the patient was given instructions to immediately contact us, at any time, without hesitation. In any case, we plan to contact the patient by telephone for a follow-up status report regarding this interventional procedure.  Comments:  No additional relevant information.  Plan of Care  Orders:  Orders Placed This Encounter  Procedures   DG PAIN CLINIC C-ARM 1-60 MIN NO REPORT    Intraoperative interpretation by procedural physician  at [facility].    Standing Status:   Standing    Number of Occurrences:   1    Order Specific Question:   Reason  for exam:    Answer:   Assistance in needle guidance and placement for procedures requiring needle placement in or near specific anatomical locations not easily accessible without such assistance.     Medications ordered for procedure: Meds ordered this encounter  Medications   iohexol (OMNIPAQUE) 180 MG/ML injection 10 mL    Must be Myelogram-compatible. If not available, you may substitute with a water-soluble, non-ionic, hypoallergenic, myelogram-compatible radiological contrast medium.   lidocaine (XYLOCAINE) 2 % (with pres) injection 400 mg   diazepam (VALIUM) tablet 5 mg    Make sure Flumazenil is available in the pyxis when using this medication. If oversedation occurs, administer 0.2 mg IV over 15 sec. If after 45 sec no response, administer 0.2 mg again over 1 min; may repeat at 1 min intervals; not to exceed 4 doses (1 mg)   dexamethasone (DECADRON) injection 10 mg   methylPREDNISolone acetate (DEPO-MEDROL) injection 40 mg   ropivacaine (PF) 2 mg/mL (0.2%) (NAROPIN) injection 9 mL   Medications administered: We administered iohexol, lidocaine, diazepam, dexamethasone, methylPREDNISolone acetate, and ropivacaine (PF) 2 mg/mL (0.2%).  See the medical record for exact dosing, route, and time of administration.  Follow-up plan:   Return in about 5 weeks (around 07/17/2022) for Post Procedure Evaluation, virtual.       R SIJ + R Piriformis: 04/01/22, 06/12/22   Recent Visits Date Type Provider Dept  05/06/22 Office Visit Gillis Santa, MD [facility]  04/01/22 Procedure visit Gillis Santa, MD [facility]  03/19/22 Office Visit Gillis Santa, MD [facility]  Showing recent visits within past 90 days and meeting all other requirements Today's Visits Date Type Provider Dept  06/12/22 Procedure visit Gillis Santa, MD [facility]  Showing today's visits and meeting all other requirements Future Appointments Date Type Provider Dept  07/17/22 Appointment Gillis Santa, MD [facility]  Showing future appointments within next 90 days and meeting all other requirements  Disposition: Discharge home  Discharge (Date  Time): 06/12/2022; 1115 hrs.   Primary Care Physician: Olin Hauser, DO Location: [facility] Note by: Gillis Santa, MD Date: 06/12/2022; Time: 11:23 AM  Disclaimer:  Medicine is not an exact science. The only guarantee in medicine is that nothing is guaranteed. It is important to note that the decision to proceed with this intervention was based on the information collected from the patient. The Data and conclusions were drawn from the patient's questionnaire, the interview, and the physical examination. Because the information was provided in large part by the patient, it cannot be guaranteed that it has not been purposely or unconsciously manipulated. Every effort has been made to obtain as much relevant data as possible for this evaluation. It is important to note that the conclusions that lead to this procedure are derived in large part from the available data. Always take into account that the treatment will also be dependent on availability of resources and existing treatment guidelines, considered by other Pain Management Practitioners as being common knowledge and practice, at the time of the intervention. For Medico-Legal purposes, it is also important to point out that variation in procedural techniques and pharmacological choices are the acceptable norm. The indications, contraindications, technique, and results of the above procedure should only be interpreted and judged by a Board-Certified Interventional Pain Specialist with extensive familiarity and expertise in the same exact procedure and technique.

## 2022-06-12 NOTE — Patient Instructions (Signed)
Sacroiliac (SI) Joint Injection Patient Information  Description: The sacroiliac joint connects the scrum (very low back and tailbone) to the ilium (a pelvic bone which also forms half of the hip joint).  Normally this joint experiences very little motion.  When this joint becomes inflamed or unstable low back and or hip and pelvis pain may result.  Injection of this joint with local anesthetics (numbing medicines) and steroids can provide diagnostic information and reduce pain.  This injection is performed with the aid of x-ray guidance into the tailbone area while you are lying on your stomach.   You may experience an electrical sensation down the leg while this is being done.  You may also experience numbness.  We also may ask if we are reproducing your normal pain during the injection.  Conditions which may be treated SI injection:  Low back, buttock, hip or leg pain  Preparation for the Injection:  Do not eat any solid food or dairy products within 8 hours of your appointment.  You may drink clear liquids up to 3 hours before appointment.  Clear liquids include water, black coffee, juice or soda.  No milk or cream please. You may take your regular medications, including pain medications with a sip of water before your appointment.  Diabetics should hold regular insulin (if take separately) and take 1/2 normal NPH dose the morning of the procedure.  Carry some sugar containing items with you to your appointment. A driver must accompany you and be prepared to drive you home after your procedure. Bring all of your current medications with you. An IV may be inserted and sedation may be given at the discretion of the physician. A blood pressure cuff, EKG and other monitors will often be applied during the procedure.  Some patients may need to have extra oxygen administered for a short period.  You will be asked to provide medical information, including your allergies, prior to the procedure.  We  must know immediately if you are taking blood thinners (like Coumadin/Warfarin) or if you are allergic to IV iodine contrast (dye).  We must know if you could possible be pregnant.  Possible side effects:  Bleeding from needle site Infection (rare, may require surgery) Nerve injury (rare) Numbness & tingling (temporary) A brief convulsion or seizure Light-headedness (temporary) Pain at injection site (several days) Decreased blood pressure (temporary) Weakness in the leg (temporary)   Call if you experience:  New onset weakness or numbness of an extremity below the injection site that last more than 8 hours. Hives or difficulty breathing ( go to the emergency room) Inflammation or drainage at the injection site Any new symptoms which are concerning to you  Please note:  Although the local anesthetic injected can often make your back/ hip/ buttock/ leg feel good for several hours after the injections, the pain will likely return.  It takes 3-7 days for steroids to work in the sacroiliac area.  You may not notice any pain relief for at least that one week.  If effective, we will often do a series of three injections spaced 3-6 weeks apart to maximally decrease your pain.  After the initial series, we generally will wait some months before a repeat injection of the same type.  If you have any questions, please call [phone] [facility]  Pain Management Discharge Instructions  General Discharge Instructions :  If you need to reach your doctor call: Monday-Friday 8:00 am - 4:00 pm at   [phone] or toll free [phone].  After clinic hours [phone] to have operator reach doctor.  Bring all of your medication bottles to all your appointments in the pain clinic.  To cancel or reschedule your appointment with Pain Management please remember to call 24 hours in advance to avoid a fee.  Refer to the educational materials which you have  been given on: General Risks, I had my Procedure. Discharge Instructions, Post Sedation.  Post Procedure Instructions:  The drugs you were given will stay in your system until tomorrow, so for the next 24 hours you should not drive, make any legal decisions or drink any alcoholic beverages.  You may eat anything you prefer, but it is better to start with liquids then soups and crackers, and gradually work up to solid foods.  Please notify your doctor immediately if you have any unusual bleeding, trouble breathing or pain that is not related to your normal pain.  Depending on the type of procedure that was done, some parts of your body may feel week and/or numb.  This usually clears up by tonight or the next day.  Walk with the use of an assistive device or accompanied by an adult for the 24 hours.  You may use ice on the affected area for the first 24 hours.  Put ice in a Ziploc bag and cover with a towel and place against area 15 minutes on 15 minutes off.  You may switch to heat after 24 hours. 

## 2022-06-13 ENCOUNTER — Telehealth: Payer: Self-pay | Admitting: *Deleted

## 2022-06-13 NOTE — Telephone Encounter (Signed)
Post procedure call;  patient reports that she is doing well.  

## 2022-07-16 ENCOUNTER — Telehealth: Payer: Self-pay

## 2022-07-16 NOTE — Telephone Encounter (Signed)
LM for patient to call office

## 2022-07-17 ENCOUNTER — Telehealth: Payer: Self-pay | Admitting: *Deleted

## 2022-07-17 ENCOUNTER — Ambulatory Visit
Payer: BC Managed Care – PPO | Attending: Student in an Organized Health Care Education/Training Program | Admitting: Student in an Organized Health Care Education/Training Program

## 2022-07-17 DIAGNOSIS — G5701 Lesion of sciatic nerve, right lower limb: Secondary | ICD-10-CM

## 2022-07-17 DIAGNOSIS — M533 Sacrococcygeal disorders, not elsewhere classified: Secondary | ICD-10-CM

## 2022-07-17 DIAGNOSIS — G8929 Other chronic pain: Secondary | ICD-10-CM

## 2022-07-17 NOTE — Telephone Encounter (Signed)
Attempted to call for pre appointment review of allergies/meds. Message left. 

## 2022-07-17 NOTE — Progress Notes (Signed)
I attempted to call the patient however no response.  -Dr Sherronda Sweigert  

## 2022-08-13 ENCOUNTER — Ambulatory Visit
Admission: RE | Admit: 2022-08-13 | Discharge: 2022-08-13 | Disposition: A | Discharge status: Home / Self Care | Payer: BC Managed Care – PPO | Source: Ambulatory Visit | Attending: Family Medicine | Admitting: Family Medicine

## 2022-08-13 DIAGNOSIS — Z1231 Encounter for screening mammogram for malignant neoplasm of breast: Secondary | ICD-10-CM | POA: Insufficient documentation

## 2022-08-23 ENCOUNTER — Encounter: Payer: Self-pay | Admitting: Family Medicine

## 2022-08-23 ENCOUNTER — Ambulatory Visit (INDEPENDENT_AMBULATORY_CARE_PROVIDER_SITE_OTHER): Payer: BC Managed Care – PPO | Admitting: Family Medicine

## 2022-08-23 VITALS — BP 140/80 | HR 62 | Ht 62.0 in | Wt 122.8 lb

## 2022-08-23 DIAGNOSIS — Z124 Encounter for screening for malignant neoplasm of cervix: Secondary | ICD-10-CM

## 2022-08-23 DIAGNOSIS — B351 Tinea unguium: Secondary | ICD-10-CM | POA: Diagnosis not present

## 2022-08-23 DIAGNOSIS — Z23 Encounter for immunization: Secondary | ICD-10-CM | POA: Diagnosis not present

## 2022-08-23 MED ORDER — TERBINAFINE HCL 250 MG PO TABS: 250.0000 mg | ORAL_TABLET | Freq: Every day | ORAL | 2 refills | Status: DC

## 2022-08-23 NOTE — Progress Notes (Incomplete)
Subjective:    Patient ID: Sonia Eaton, female    DOB: 1976-09-06, 47 y.o.   MRN: [id number]  Sonia Eaton is a 46 y.o. female presenting on 08/23/2022 for follow up mammogram   HPI  Elevated BP Reports home BP readings, 120-130s / 70-80s and question if previous readings were accurate. Usually 121-125 range. Current Meds - Not on medication   Denies CP, dyspnea, HA, edema, dizziness / lightheadedness   Updates Chronic Low Back Pain / sciatica, recently referred to Sports Medicine Dr Zigmund Daniel and ultimately she has proceeded to Lumbar spine MRI done recently and now waiting on current referral to Spine Specialist injection therapy.   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 Recent flare worsening after resumed smoking.   Health Maintenance: Future Cologuard if covered, she will try to upgrade preventative care component of insurance.  Flu Shot today  COVID booster in near future      06/12/2022   10:18 AM 03/19/2022    1:03 PM 01/15/2022    2:03 PM  Depression screen PHQ 2/9  Decreased Interest 0 0 0  Down, Depressed, Hopeless 0 0 0  PHQ - 2 Score 0 0 0  Altered sleeping   0  Tired, decreased energy   0  Change in appetite   0  Feeling bad or failure about yourself    0  Trouble concentrating   0  Moving slowly or fidgety/restless   0  Suicidal thoughts   0  PHQ-9 Score   0  Difficult doing work/chores   Not difficult at all    Social History   Tobacco Use  . Smoking status: Every Day    Packs/day: 0.50    Years: 30.00    Total pack years: 15.00    Types: Cigarettes  . Smokeless tobacco: Never  Vaping Use  . Vaping Use: Never used  Substance Use Topics  . Alcohol use: Not Currently  . Drug use: Not Currently    Types: Marijuana    Review of Systems Per HPI unless specifically indicated above     Objective:    BP (!) 140/80   Pulse 62   Ht 5' 2" (1.575 m)   Wt 122 lb  12.8 oz (55.7 kg)   SpO2 100%   BMI 22.46 kg/m   Wt Readings from Last 3 Encounters:  08/23/22 122 lb 12.8 oz (55.7 kg)  06/12/22 120 lb (54.4 kg)  04/01/22 120 lb (54.4 kg)    Physical Exam  I have personally reviewed the radiology report from 08/13/22.   CLINICAL DATA:  Screening.   EXAM: DIGITAL SCREENING BILATERAL MAMMOGRAM WITH TOMOSYNTHESIS AND CAD   TECHNIQUE: Bilateral screening digital craniocaudal and mediolateral oblique mammograms were obtained. Bilateral screening digital breast tomosynthesis was performed. The images were evaluated with computer-aided detection.   COMPARISON:  This is the patient's baseline mammogram.   ACR Breast Density Category c: The breast tissue is heterogeneously dense, which may obscure small masses   FINDINGS: There are no findings suspicious for malignancy.   IMPRESSION: No mammographic evidence of malignancy. A result letter of this screening mammogram will be mailed directly to the patient.   RECOMMENDATION: Screening mammogram in one year. (Code:[id number]-B-01Y)   BI-RADS CATEGORY  1: Negative.     Electronically Signed   By: Zerita Boers M.D.   On: 08/14/2022 13:41  Results for orders placed or performed in visit on 01/08/22  HIV Antibody (routine  testing w rflx)  Result Value Ref Range   HIV 1&2 Ab, 4th Generation NON-REACTIVE NON-REACTIVE  Hepatitis C antibody  Result Value Ref Range   Hepatitis C Ab REACTIVE (A) NON-REACTIVE   SIGNAL TO CUT-OFF >11.00 (H) <1.00  Hemoglobin A1c  Result Value Ref Range   Hgb A1c MFr Bld 5.2 <5.7 % of total Hgb   Mean Plasma Glucose 103 mg/dL   eAG (mmol/L) 5.7 mmol/L  CBC with Differential/Platelet  Result Value Ref Range   WBC 7.6 3.8 - 10.8 Thousand/uL   RBC 4.09 3.80 - 5.10 Million/uL   Hemoglobin 13.0 11.7 - 15.5 g/dL   HCT 39.5 35.0 - 45.0 %   MCV 96.6 80.0 - 100.0 fL   MCH 31.8 27.0 - 33.0 pg   MCHC 32.9 32.0 - 36.0 g/dL   RDW 11.7 11.0 - 15.0 %   Platelets 310 140 -  400 Thousand/uL   MPV 10.5 7.5 - 12.5 fL   Neutro Abs 4,393 1,500 - 7,800 cells/uL   Lymphs Abs 2,668 850 - 3,900 cells/uL   Absolute Monocytes 471 200 - 950 cells/uL   Eosinophils Absolute 38 15 - 500 cells/uL   Basophils Absolute 30 0 - 200 cells/uL   Neutrophils Relative % 57.8 %   Total Lymphocyte 35.1 %   Monocytes Relative 6.2 %   Eosinophils Relative 0.5 %   Basophils Relative 0.4 %  Lipid panel  Result Value Ref Range   Cholesterol 193 <200 mg/dL   HDL 50 > OR = 50 mg/dL   Triglycerides 64 <150 mg/dL   LDL Cholesterol (Calc) 127 (H) mg/dL (calc)   Total CHOL/HDL Ratio 3.9 <5.0 (calc)   Non-HDL Cholesterol (Calc) 143 (H) <130 mg/dL (calc)  COMPLETE METABOLIC PANEL WITH GFR  Result Value Ref Range   Glucose, Bld 88 65 - 99 mg/dL   BUN 10 7 - 25 mg/dL   Creat 0.63 0.50 - 0.99 mg/dL   eGFR 111 > OR = 60 mL/min/1.82m   BUN/Creatinine Ratio NOT APPLICABLE 6 - 22 (calc)   Sodium 142 135 - 146 mmol/L   Potassium 4.0 3.5 - 5.3 mmol/L   Chloride 106 98 - 110 mmol/L   CO2 29 20 - 32 mmol/L   Calcium 9.6 8.6 - 10.2 mg/dL   Total Protein 6.8 6.1 - 8.1 g/dL   Albumin 4.3 3.6 - 5.1 g/dL   Globulin 2.5 1.9 - 3.7 g/dL (calc)   AG Ratio 1.7 1.0 - 2.5 (calc)   Total Bilirubin 0.3 0.2 - 1.2 mg/dL   Alkaline phosphatase (APISO) 64 31 - 125 U/L   AST 17 10 - 35 U/L   ALT 14 6 - 29 U/L  HCV RNA, Quantitative Real Time PCR  Result Value Ref Range   HCV RNA, PCR, QN <15 NOT DETECTED NOT DETECTED IU/mL   HCV Quantitative Log <1.18 NOT DETECTED NOT DETECTED Log IU/mL      Assessment & Plan:   Problem List Items Addressed This Visit   None Visit Diagnoses     Cervical cancer screening    -  Primary   Relevant Orders   Ambulatory referral to Obstetrics / Gynecology   Needs flu shot       Relevant Orders   Flu Vaccine QUAD 650moM (Fluarix, Fluzone & Alfiuria Quad PF) (Completed)   Onychomycosis of toenail       Relevant Medications   terbinafine (LAMISIL) 250 MG tablet        Recommend to check  with a new insurance plan in the future, that covers the preventative care including the Cologuard. We can re-discuss next year if/when this is changed and order that test. If not then we can still pursue the Colonoscopy.  Referral to OBGYN through [facility] - they will schedule you soon and contact you with an appointment.  For the toenail, I would suggest treating as fungal infection. Once daily for 3 months, refills sent, 30 days at a time.  It will take time to grow in properly. If still not functioning correct we can refer to Podiatrist.  Orders Placed This Encounter  Procedures  . Flu Vaccine QUAD 67moIM (Fluarix, Fluzone & Alfiuria Quad PF)  . Ambulatory referral to Obstetrics / Gynecology    Referral Priority:   Routine    Referral Type:   Consultation    Referral Reason:   Specialty Services Required    Requested Specialty:   Obstetrics and Gynecology    Number of Visits Requested:   1     Meds ordered this encounter  Medications  . terbinafine (LAMISIL) 250 MG tablet    Sig: Take 1 tablet (250 mg total) by mouth daily. For up to 3 month total    Dispense:  30 tablet    Refill:  2      Follow up plan: Return in about 6 months (around 02/21/2023) for 6 month fasting lab only then 1 week later Annual Physical.  Future labs ordered for 6 months   ANobie Putnam [facility] 08/23/2022, 3:10 PM

## 2022-08-23 NOTE — Patient Instructions (Addendum)
Thank you for coming to the office today.  Recommend to check with a new insurance plan in the future, that covers the preventative care including the Cologuard. We can re-discuss next year if/when this is changed and order that test. If not then we can still pursue the Colonoscopy.  Referral to OBGYN through [facility] - they will schedule you soon and contact you with an appointment.  For the toenail, I would suggest treating as fungal infection. Once daily for 3 months, refills sent, 30 days at a time.  It will take time to grow in properly. If still not functioning correct we can refer to Podiatrist.   DUE for FASTING BLOOD WORK (no food or drink after midnight before the lab appointment, only water or coffee without cream/sugar on the morning of)  SCHEDULE "Lab Only" visit in the morning at the clinic for lab draw in 6 MONTHS   - Make sure Lab Only appointment is at about 1 week before your next appointment, so that results will be available  For Lab Results, once available within 2-3 days of blood draw, you can can log in to MyChart online to view your results and a brief explanation. Also, we can discuss results at next follow-up visit.   Please schedule a Follow-up Appointment to: Return in about 6 months (around 02/21/2023) for 6 month fasting lab only then 1 week later Annual Physical.  If you have any other questions or concerns, please feel free to call the office or send a message through [facility]. You may also schedule an earlier appointment if necessary.  Additionally, you may be receiving a survey about your experience at our office within a few days to 1 week by e-mail or mail. We value your feedback.  Nobie Putnam, DO [place]

## 2022-08-23 NOTE — Progress Notes (Unsigned)
Subjective:    Patient ID: Sonia Eaton, female    DOB: 1976/09/11, 46 y.o.   MRN: [id number]  Sonia Eaton is a 46 y.o. female presenting on 08/23/2022 for follow up mammogram   HPI  Elevated BP Reports home BP readings, 120-130s / 70-80s and question if previous readings were accurate. Usually 121-125 range. Current Meds - Not on medication   Denies CP, dyspnea, HA, edema, dizziness / lightheadedness   Updates Chronic Low Back Pain / sciatica, recently referred to Sports Medicine Dr Zigmund Daniel and ultimately she has proceeded to Lumbar spine MRI done recently and now waiting on current referral to Spine Specialist injection therapy.   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 Recent flare worsening after resumed smoking.   Health Maintenance: Future Cologuard if covered, she will try to upgrade preventative care component of insurance.  Flu Shot today  COVID booster in near future      06/12/2022   10:18 AM 03/19/2022    1:03 PM 01/15/2022    2:03 PM  Depression screen PHQ 2/9  Decreased Interest 0 0 0  Down, Depressed, Hopeless 0 0 0  PHQ - 2 Score 0 0 0  Altered sleeping   0  Tired, decreased energy   0  Change in appetite   0  Feeling bad or failure about yourself    0  Trouble concentrating   0  Moving slowly or fidgety/restless   0  Suicidal thoughts   0  PHQ-9 Score   0  Difficult doing work/chores   Not difficult at all    Social History   Tobacco Use   Smoking status: Every Day    Packs/day: 0.50    Years: 30.00    Total pack years: 15.00    Types: Cigarettes   Smokeless tobacco: Never  Vaping Use   Vaping Use: Never used  Substance Use Topics   Alcohol use: Not Currently   Drug use: Not Currently    Types: Marijuana    Review of Systems Per HPI unless specifically indicated above     Objective:    BP (!) 140/80   Pulse 62   Ht '5\' 2"'  (1.575 m)   Wt 122 lb 12.8 oz  (55.7 kg)   SpO2 100%   BMI 22.46 kg/m   Wt Readings from Last 3 Encounters:  08/23/22 122 lb 12.8 oz (55.7 kg)  06/12/22 120 lb (54.4 kg)  04/01/22 120 lb (54.4 kg)    Physical Exam  I have personally reviewed the radiology report from 08/13/22.   CLINICAL DATA:  Screening.   EXAM: DIGITAL SCREENING BILATERAL MAMMOGRAM WITH TOMOSYNTHESIS AND CAD   TECHNIQUE: Bilateral screening digital craniocaudal and mediolateral oblique mammograms were obtained. Bilateral screening digital breast tomosynthesis was performed. The images were evaluated with computer-aided detection.   COMPARISON:  This is the patient's baseline mammogram.   ACR Breast Density Category c: The breast tissue is heterogeneously dense, which may obscure small masses   FINDINGS: There are no findings suspicious for malignancy.   IMPRESSION: No mammographic evidence of malignancy. A result letter of this screening mammogram will be mailed directly to the patient.   RECOMMENDATION: Screening mammogram in one year. (Code:SM-B-01Y)   BI-RADS CATEGORY  1: Negative.     Electronically Signed   By: Zerita Boers M.D.   On: 08/14/2022 13:41  Results for orders placed or performed in visit on 01/08/22  HIV Antibody (routine  testing w rflx)  Result Value Ref Range   HIV 1&2 Ab, 4th Generation NON-REACTIVE NON-REACTIVE  Hepatitis C antibody  Result Value Ref Range   Hepatitis C Ab REACTIVE (A) NON-REACTIVE   SIGNAL TO CUT-OFF >11.00 (H) <1.00  Hemoglobin A1c  Result Value Ref Range   Hgb A1c MFr Bld 5.2 <5.7 % of total Hgb   Mean Plasma Glucose 103 mg/dL   eAG (mmol/L) 5.7 mmol/L  CBC with Differential/Platelet  Result Value Ref Range   WBC 7.6 3.8 - 10.8 Thousand/uL   RBC 4.09 3.80 - 5.10 Million/uL   Hemoglobin 13.0 11.7 - 15.5 g/dL   HCT 39.5 35.0 - 45.0 %   MCV 96.6 80.0 - 100.0 fL   MCH 31.8 27.0 - 33.0 pg   MCHC 32.9 32.0 - 36.0 g/dL   RDW 11.7 11.0 - 15.0 %   Platelets 310 140 - 400  Thousand/uL   MPV 10.5 7.5 - 12.5 fL   Neutro Abs 4,393 1,500 - 7,800 cells/uL   Lymphs Abs 2,668 850 - 3,900 cells/uL   Absolute Monocytes 471 200 - 950 cells/uL   Eosinophils Absolute 38 15 - 500 cells/uL   Basophils Absolute 30 0 - 200 cells/uL   Neutrophils Relative % 57.8 %   Total Lymphocyte 35.1 %   Monocytes Relative 6.2 %   Eosinophils Relative 0.5 %   Basophils Relative 0.4 %  Lipid panel  Result Value Ref Range   Cholesterol 193 <200 mg/dL   HDL 50 > OR = 50 mg/dL   Triglycerides 64 <150 mg/dL   LDL Cholesterol (Calc) 127 (H) mg/dL (calc)   Total CHOL/HDL Ratio 3.9 <5.0 (calc)   Non-HDL Cholesterol (Calc) 143 (H) <130 mg/dL (calc)  COMPLETE METABOLIC PANEL WITH GFR  Result Value Ref Range   Glucose, Bld 88 65 - 99 mg/dL   BUN 10 7 - 25 mg/dL   Creat 0.63 0.50 - 0.99 mg/dL   eGFR 111 > OR = 60 mL/min/1.20m   BUN/Creatinine Ratio NOT APPLICABLE 6 - 22 (calc)   Sodium 142 135 - 146 mmol/L   Potassium 4.0 3.5 - 5.3 mmol/L   Chloride 106 98 - 110 mmol/L   CO2 29 20 - 32 mmol/L   Calcium 9.6 8.6 - 10.2 mg/dL   Total Protein 6.8 6.1 - 8.1 g/dL   Albumin 4.3 3.6 - 5.1 g/dL   Globulin 2.5 1.9 - 3.7 g/dL (calc)   AG Ratio 1.7 1.0 - 2.5 (calc)   Total Bilirubin 0.3 0.2 - 1.2 mg/dL   Alkaline phosphatase (APISO) 64 31 - 125 U/L   AST 17 10 - 35 U/L   ALT 14 6 - 29 U/L  HCV RNA, Quantitative Real Time PCR  Result Value Ref Range   HCV RNA, PCR, QN <15 NOT DETECTED NOT DETECTED IU/mL   HCV Quantitative Log <1.18 NOT DETECTED NOT DETECTED Log IU/mL      Assessment & Plan:   Problem List Items Addressed This Visit   None   No orders of the defined types were placed in this encounter.     Follow up plan: No follow-ups on file.  ***Future labs ordered for ***  A total of *** (lvl 3, 4, 5) 15, 25, 40 minutes was spent face-to-face with this patient. Greater than 50% (approximately *** minutes) of this time was spent in counseling and coordination of care with the  patient. ***  ANobie Putnam DO [facility]  08/23/2022, 3:10 PM

## 2022-08-24 ENCOUNTER — Encounter: Payer: Self-pay | Admitting: Family Medicine

## 2022-08-24 DIAGNOSIS — G5701 Lesion of sciatic nerve, right lower limb: Secondary | ICD-10-CM

## 2022-08-24 DIAGNOSIS — M5431 Sciatica, right side: Secondary | ICD-10-CM

## 2022-08-24 DIAGNOSIS — M5416 Radiculopathy, lumbar region: Secondary | ICD-10-CM

## 2022-08-27 MED ORDER — GABAPENTIN 100 MG PO CAPS: 100.0000 mg | ORAL_CAPSULE | Freq: Every day | ORAL | 3 refills | Status: AC

## 2022-09-20 ENCOUNTER — Ambulatory Visit: Payer: BC Managed Care – PPO | Admitting: Family Medicine

## 2022-09-25 ENCOUNTER — Ambulatory Visit: Payer: BC Managed Care – PPO | Admitting: Advanced Practice Midwife

## 2022-09-25 ENCOUNTER — Ambulatory Visit: Payer: BC Managed Care – PPO | Admitting: Family Medicine

## 2022-09-25 ENCOUNTER — Other Ambulatory Visit (HOSPITAL_COMMUNITY)
Admission: RE | Admit: 2022-09-25 | Discharge: 2022-09-25 | Disposition: A | Discharge status: Home / Self Care | Payer: BC Managed Care – PPO | Source: Ambulatory Visit | Attending: Advanced Practice Midwife | Admitting: Advanced Practice Midwife

## 2022-09-25 ENCOUNTER — Encounter: Payer: Self-pay | Admitting: Advanced Practice Midwife

## 2022-09-25 VITALS — BP 128/84 | Ht 62.0 in | Wt 123.0 lb

## 2022-09-25 DIAGNOSIS — Z124 Encounter for screening for malignant neoplasm of cervix: Secondary | ICD-10-CM | POA: Diagnosis not present

## 2022-09-25 DIAGNOSIS — Z01419 Encounter for gynecological examination (general) (routine) without abnormal findings: Secondary | ICD-10-CM

## 2022-09-25 NOTE — Progress Notes (Signed)
Patient ID: Sonia Eaton, female   DOB: Nov 27, 1976, 46 y.o.   MRN: [id number]  Reason for Consult: Gyn Exam   Referred by Nobie Putnam *  Subjective:  HPI:  Sonia Eaton is a 46 y.o. G3 P86 female being seen for cervical cancer screening by referral from her PCP. 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 She does not have access to the record. We will do PAP today. She has no other Gyn concerns. Her mammogram is up to date and is normal. She will plan colonoscopy with her PCP. She uses Mirena IUD- placed in 2021- for birth control and has no bleeding. She denies peri-menopausal symptoms besides some emotional lability. She denies concern for STDs.  She reports adequate exercise. Her dietary weakness is processed foods and inadequate calories. She drinks mostly Pepsi. She usually has 7-9 hours of sleep. She has some stress associated with her job and also with upcoming move to [facility].   Past Medical History:  Diagnosis Date   Hyperlipidemia    Hypertension    Family History  Problem Relation Age of Onset   Diabetes Mother    Heart attack Father 55   Diabetes Maternal Grandmother    Heart attack Maternal Grandfather    Colon cancer Neg Hx    Past Surgical History:  Procedure Laterality Date   CESAREAN SECTION  2014   [facility]   HAND SURGERY  2005   carpal tunnel   INTRAUTERINE DEVICE INSERTION  01/2020    Short Social History:  Social History   Tobacco Use   Smoking status: Every Day    Packs/day: 0.50    Years: 30.00    Total pack years: 15.00    Types: Cigarettes   Smokeless tobacco: Never  Substance Use Topics   Alcohol use: Not Currently    Allergies  Allergen Reactions   Tuberculin, Ppd Rash    Current Outpatient Medications  Medication Sig Dispense Refill   Aspirin-Salicylamide-Caffeine (BC HEADACHE POWDER PO) Take 1 Package by mouth 2 (two) times a week.     esomeprazole (NEXIUM) 20 MG capsule  Take 20 mg by mouth daily.     gabapentin (NEURONTIN) 100 MG capsule Take 1 capsule (100 mg total) by mouth at bedtime. 90 capsule 3   levonorgestrel (MIRENA) 20 MCG/DAY IUD 1 each by Intrauterine route once.     No current facility-administered medications for this visit.    Review of Systems  Constitutional:  Negative for chills and fever.  HENT:  Negative for congestion, ear discharge, ear pain, hearing loss, sinus pain and sore throat.   Eyes:  Negative for blurred vision and double vision.  Respiratory:  Negative for cough, shortness of breath and wheezing.   Cardiovascular:  Negative for chest pain, palpitations and leg swelling.  Gastrointestinal:  Negative for abdominal pain, blood in stool, constipation, diarrhea, heartburn, melena, nausea and vomiting.  Genitourinary:  Negative for dysuria, flank pain, frequency, hematuria and urgency.  Musculoskeletal:  Negative for back pain, joint pain and myalgias.  Skin:  Negative for itching and rash.  Neurological:  Negative for dizziness, tingling, tremors, sensory change, speech change, focal weakness, seizures, loss of consciousness, weakness and headaches.  Endo/Heme/Allergies:  Negative for environmental allergies. Does not bruise/bleed easily.  Psychiatric/Behavioral:  Negative for depression, hallucinations, memory loss, substance abuse and suicidal ideas. The patient is not nervous/anxious and does not have insomnia.  Objective:  Objective   Vitals:   09/25/22 0911  BP: 128/84  Weight: 123 lb (55.8 kg)  Height: 5\' 2"  (1.575 m)   Body mass index is 22.5 kg/m. Constitutional: Well nourished, well developed female in no acute distress.  HEENT: normal Skin: Warm and dry.  Cardiovascular: Regular rate and rhythm.   Extremity:  no edema   Respiratory: Clear to auscultation bilateral. Normal respiratory effort Psych: Alert and Oriented x3. No memory deficits. Normal mood and affect.  MS: normal gait, normal bilateral  lower extremity ROM/strength/stability.  Pelvic exam:  is not limited by body habitus EGBUS: within normal limits Vagina: within normal limits and with normal mucosa  Cervix: grossly normal appearance, IUD strings visible- 2cm   Assessment/Plan:     46 y.o. G3 P3 female cervical cancer screening  PAP smear Follow up as needed after lab results   49 CNM [facility] 09/25/2022,, 1:15 PM

## 2022-09-25 NOTE — Patient Instructions (Signed)
Mediterranean Diet A Mediterranean diet refers to food and lifestyle choices that are based on the traditions of countries located on the [employer]. It focuses on eating more fruits, vegetables, whole grains, beans, nuts, seeds, and heart-healthy fats, and eating less dairy, meat, eggs, and processed foods with added sugar, salt, and fat. This way of eating has been shown to help prevent certain conditions and improve outcomes for people who have chronic diseases, like kidney disease and heart disease. What are tips for following this plan? Reading food labels Check the serving size of packaged foods. For foods such as rice and pasta, the serving size refers to the amount of cooked product, not dry. Check the total fat in packaged foods. Avoid foods that have saturated fat or trans fats. Check the ingredient list for added sugars, such as corn syrup. Shopping  Buy a variety of foods that offer a balanced diet, including: Fresh fruits and vegetables (produce). Grains, beans, nuts, and seeds. Some of these may be available in unpackaged forms or large amounts (in bulk). Fresh seafood. Poultry and eggs. Low-fat dairy products. Buy whole ingredients instead of prepackaged foods. Buy fresh fruits and vegetables in-season from local farmers markets. Buy plain frozen fruits and vegetables. If you do not have access to quality fresh seafood, buy precooked frozen shrimp or canned fish, such as tuna, salmon, or sardines. Stock your pantry so you always have certain foods on hand, such as olive oil, canned tuna, canned tomatoes, rice, pasta, and beans. Cooking Cook foods with extra-virgin olive oil instead of using butter or other vegetable oils. Have meat as a side dish, and have vegetables or grains as your main dish. This means having meat in small portions or adding small amounts of meat to foods like pasta or stew. Use beans or vegetables instead of meat in common dishes like chili or  lasagna. Experiment with different cooking methods. Try roasting, broiling, steaming, and sauting vegetables. Add frozen vegetables to soups, stews, pasta, or rice. Add nuts or seeds for added healthy fats and plant protein at each meal. You can add these to yogurt, salads, or vegetable dishes. Marinate fish or vegetables using olive oil, lemon juice, garlic, and fresh herbs. Meal planning Plan to eat one vegetarian meal one day each week. Try to work up to two vegetarian meals, if possible. Eat seafood two or more times a week. Have healthy snacks readily available, such as: Vegetable sticks with hummus. Greek yogurt. Fruit and nut trail mix. Eat balanced meals throughout the week. This includes: Fruit: 2-3 servings a day. Vegetables: 4-5 servings a day. Low-fat dairy: 2 servings a day. Fish, poultry, or lean meat: 1 serving a day. Beans and legumes: 2 or more servings a week. Nuts and seeds: 1-2 servings a day. Whole grains: 6-8 servings a day. Extra-virgin olive oil: 3-4 servings a day. Limit red meat and sweets to only a few servings a month. Lifestyle  Cook and eat meals together with your family, when possible. Drink enough fluid to keep your urine pale yellow. Be physically active every day. This includes: Aerobic exercise like running or swimming. Leisure activities like gardening, walking, or housework. Get 7-8 hours of sleep each night. If recommended by your health care provider, drink red wine in moderation. This means 1 glass a day for nonpregnant women and 2 glasses a day for men. A glass of wine equals 5 oz (150 mL). What foods should I eat? Fruits Apples. Apricots. Avocado. Berries. Bananas. Cherries. Dates.  Figs. Grapes. Lemons. Melon. Oranges. Peaches. Plums. Pomegranate. Vegetables Artichokes. Beets. Broccoli. Cabbage. Carrots. Eggplant. Green beans. Chard. Kale. Spinach. Onions. Leeks. Peas. Squash. Tomatoes. Peppers. Radishes. Grains Whole-grain pasta. Brown  rice. Bulgur wheat. Polenta. Couscous. Whole-wheat bread. [place]. Meats and other proteins Beans. Almonds. Sunflower seeds. Pine nuts. Peanuts. [place]. Salmon. Scallops. Shrimp. [place]. Tilapia. Clams. Oysters. Eggs. Poultry without skin. Dairy Low-fat milk. Cheese. Greek yogurt. Fats and oils Extra-virgin olive oil. Avocado oil. Grapeseed oil. Beverages Water. Red wine. Herbal tea. Sweets and desserts Greek yogurt with honey. Baked apples. Poached pears. Trail mix. Seasonings and condiments Basil. Cilantro. Coriander. Cumin. Mint. Parsley. Sage. Rosemary. Tarragon. Garlic. Oregano. Thyme. Pepper. Balsamic vinegar. Tahini. Hummus. Tomato sauce. Olives. Mushrooms. The items listed above may not be a complete list of foods and beverages you can eat. Contact a dietitian for more information. What foods should I limit? This is a list of foods that should be eaten rarely or only on special occasions. Fruits Fruit canned in syrup. Vegetables Deep-fried potatoes (french fries). Grains Prepackaged pasta or rice dishes. Prepackaged cereal with added sugar. Prepackaged snacks with added sugar. Meats and other proteins Beef. Pork. Lamb. Poultry with skin. Hot dogs. [place] Salines. Dairy Ice cream. Sour cream. Whole milk. Fats and oils Butter. Canola oil. Vegetable oil. Beef fat (tallow). Lard. Beverages Juice. Sugar-sweetened soft drinks. Beer. Liquor and spirits. Sweets and desserts Cookies. Cakes. Pies. Candy. Seasonings and condiments Mayonnaise. Pre-made sauces and marinades. The items listed above may not be a complete list of foods and beverages you should limit. Contact a dietitian for more information. Summary The Mediterranean diet includes both food and lifestyle choices. Eat a variety of fresh fruits and vegetables, beans, nuts, seeds, and whole grains. Limit the amount of red meat and sweets that you eat. If recommended by your health care provider, drink red wine in moderation.  This means 1 glass a day for nonpregnant women and 2 glasses a day for men. A glass of wine equals 5 oz (150 mL). This information is not intended to replace advice given to you by your health care provider. Make sure you discuss any questions you have with your health care provider. Document Revised: 12/24/2019 Document Reviewed: 10/21/2019 Elsevier Patient Education  [place] of Quitting Smoking Quitting smoking is a physical and mental challenge. You may have cravings, withdrawal symptoms, and temptation to smoke. Before quitting, work with your health care provider to make a plan that can help you manage quitting. Making a plan before you quit may keep you from smoking when you have the urge to smoke while trying to quit. How to manage lifestyle changes Managing stress Stress can make you want to smoke, and wanting to smoke may cause stress. It is important to find ways to manage your stress. You could try some of the following: Practice relaxation techniques. Breathe slowly and deeply, in through your nose and out through your mouth. Listen to music. Soak in a bath or take a shower. Imagine a peaceful place or vacation. Get some support. Talk with family or friends about your stress. Join a support group. Talk with a counselor or therapist. Get some physical activity. Go for a walk, run, or bike ride. Play a favorite sport. Practice yoga.  Medicines Talk with your health care provider about medicines that might help you deal with cravings and make quitting easier for you. Relationships Social situations can be difficult when you are quitting smoking. To manage this, you can:  Avoid parties and other social situations where people might be smoking. Avoid alcohol. Leave right away if you have the urge to smoke. Explain to your family and friends that you are quitting smoking. Ask for support and let them know you might be a bit grumpy. Plan  activities where smoking is not an option. General instructions Be aware that many people gain weight after they quit smoking. However, not everyone does. To keep from gaining weight, have a plan in place before you quit, and stick to the plan after you quit. Your plan should include: Eating healthy snacks. When you have a craving, it may help to: Eat popcorn, or try carrots, celery, or other cut vegetables. Chew sugar-free gum. Changing how you eat. Eat small portion sizes at meals. Eat 4-6 small meals throughout the day instead of 1-2 large meals a day. Be mindful when you eat. You should avoid watching television or doing other things that might distract you as you eat. Exercising regularly. Make time to exercise each day. If you do not have time for a long workout, do short bouts of exercise for 5-10 minutes several times a day. Do some form of strengthening exercise, such as weight lifting. Do some exercise that gets your heart beating and causes you to breathe deeply, such as walking fast, running, swimming, or biking. This is very important. Drinking plenty of water or other low-calorie or no-calorie drinks. Drink enough fluid to keep your urine pale yellow.  How to recognize withdrawal symptoms Your body and mind may experience discomfort as you try to get used to not having nicotine in your system. These effects are called withdrawal symptoms. They may include: Feeling hungrier than normal. Having trouble concentrating. Feeling irritable or restless. Having trouble sleeping. Feeling depressed. Craving a cigarette. These symptoms may surprise you, but they are normal to have when quitting smoking. To manage withdrawal symptoms: Avoid places, people, and activities that trigger your cravings. Remember why you want to quit. Get plenty of sleep. Avoid coffee and other drinks that contain caffeine. These may worsen some of your symptoms. How to manage cravings Come up with a plan  for how to deal with your cravings. The plan should include the following: A definition of the specific situation you want to deal with. An activity or action you will take to replace smoking. A clear idea for how this action will help. The name of someone who could help you with this. Cravings usually last for 5-10 minutes. Consider taking the following actions to help you with your plan to deal with cravings: Keep your mouth busy. Chew sugar-free gum. Suck on hard candies or a straw. Brush your teeth. Keep your hands and body busy. Change to a different activity right away. Squeeze or play with a ball. Do an activity or a hobby, such as making bead jewelry, practicing needlepoint, or working with wood. Mix up your normal routine. Take a short exercise break. Go for a quick walk, or run up and down stairs. Focus on doing something kind or helpful for someone else. Call a friend or family member to talk during a craving. Join a support group. Contact a quitline. Where to find support To get help or find a support group: Call the [employer] [facility]'s Smoking Quitline: 1-800-QUIT-NOW (219)111-1387) Text QUIT to SmokefreeTXT: 130865 Where to find more information Visit these websites to find more information on quitting smoking: U.S. Department of Health and Human Services: www.smokefree.gov American Lung Association: www.freedomfromsmoking.org Centers for  Disease Control and Prevention (CDC): http://www.wolf.info/ American Heart Association: www.heart.org Contact a health care provider if: You want to change your plan for quitting. The medicines you are taking are not helping. Your eating feels out of control or you cannot sleep. You feel depressed or become very anxious. Summary Quitting smoking is a physical and mental challenge. You will face cravings, withdrawal symptoms, and temptation to smoke again. Preparation can help you as you go through these challenges. Try different  techniques to manage stress, handle social situations, and prevent weight gain. You can deal with cravings by keeping your mouth busy (such as by chewing gum), keeping your hands and body busy, calling family or friends, or contacting a quitline for people who want to quit smoking. You can deal with withdrawal symptoms by avoiding places where people smoke, getting plenty of rest, and avoiding drinks that contain caffeine. This information is not intended to replace advice given to you by your health care provider. Make sure you discuss any questions you have with your health care provider. Document Revised: 11/09/2021 Document Reviewed: 11/09/2021 Elsevier Patient Education  [place].

## 2022-10-01 LAB — CYTOLOGY - PAP
Comment: NEGATIVE
Comment: NEGATIVE
Comment: NEGATIVE
Diagnosis: NEGATIVE
HPV 16: NEGATIVE
HPV 18 / 45: NEGATIVE
High risk HPV: POSITIVE — AB

## 2023-04-17 IMAGING — CR DG LUMBAR SPINE COMPLETE 4+V
5 series · 5 of 5 positions shown · non-contrast
Comparison: None.

CLINICAL DATA: Chronic right back pain

EXAM:
LUMBAR SPINE - COMPLETE 4+ VIEW

[l-spine ap]
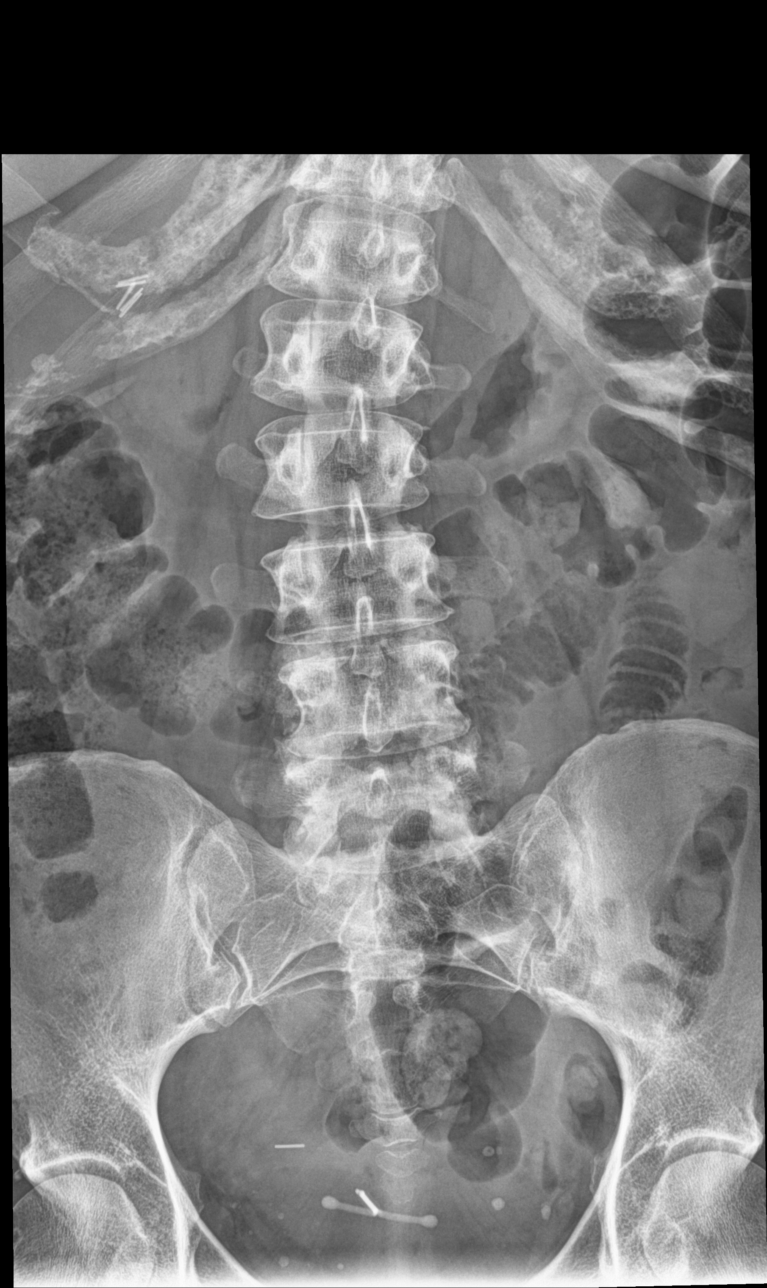

[l-spine obl (1 of 2)]
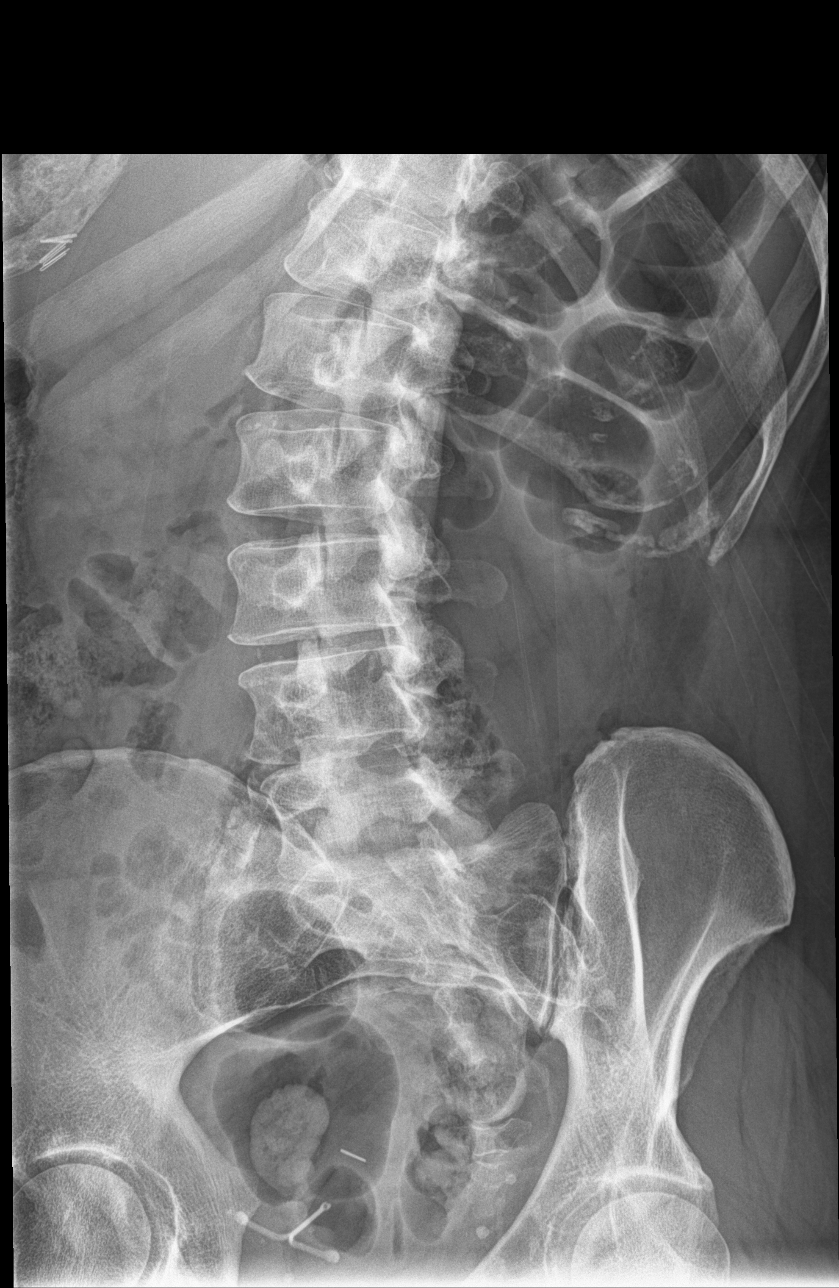

[l-spine lat]
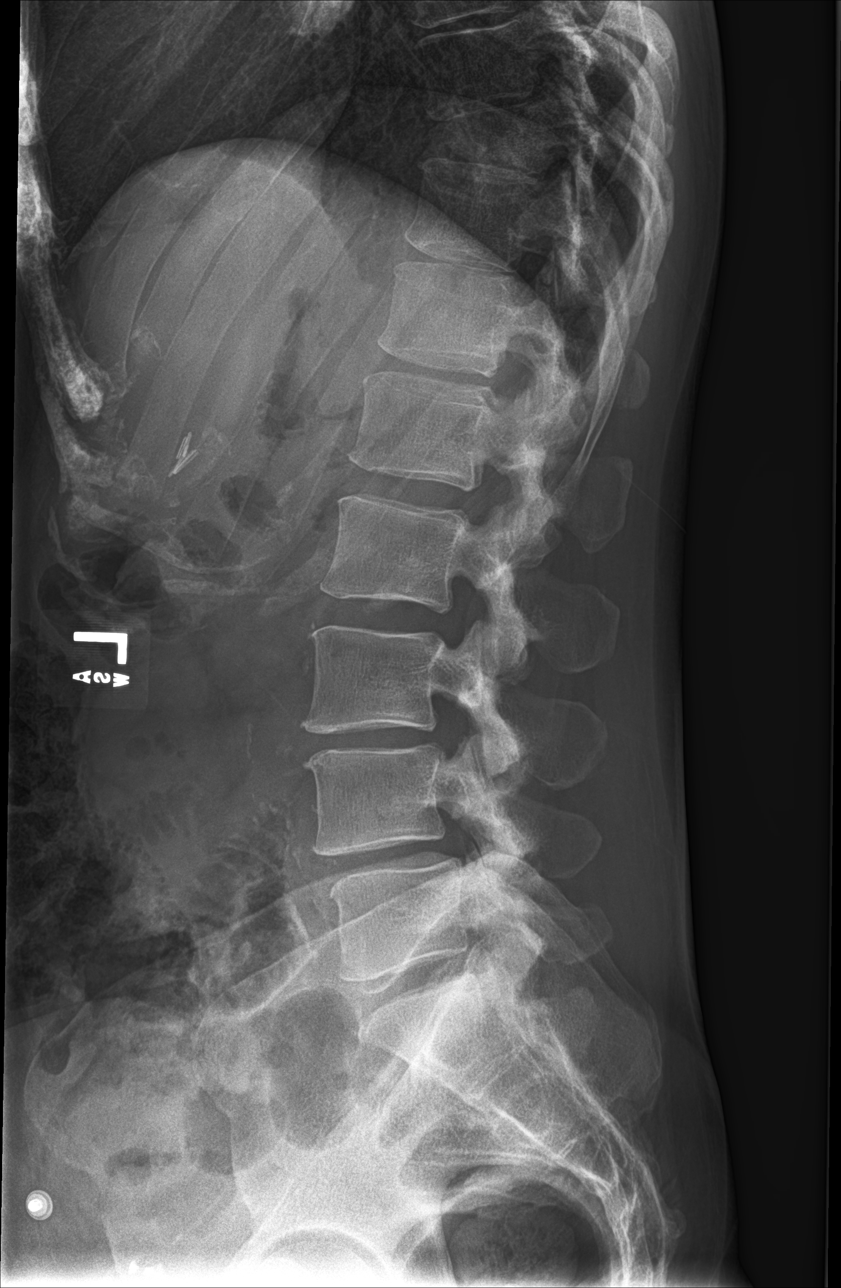

[l-spine spot]
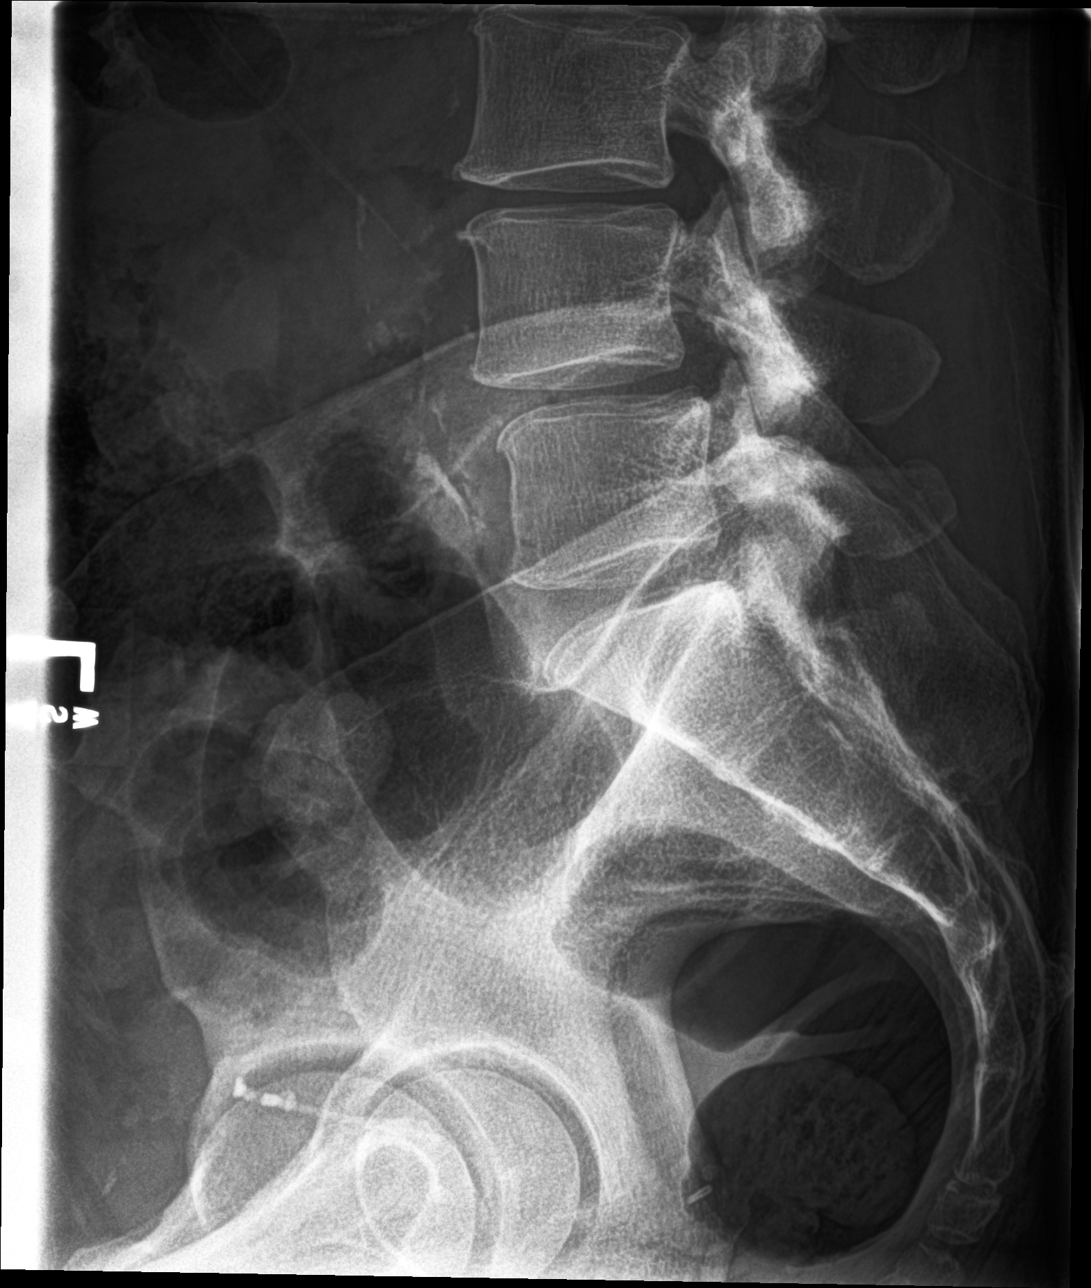

[l-spine obl (2 of 2)]
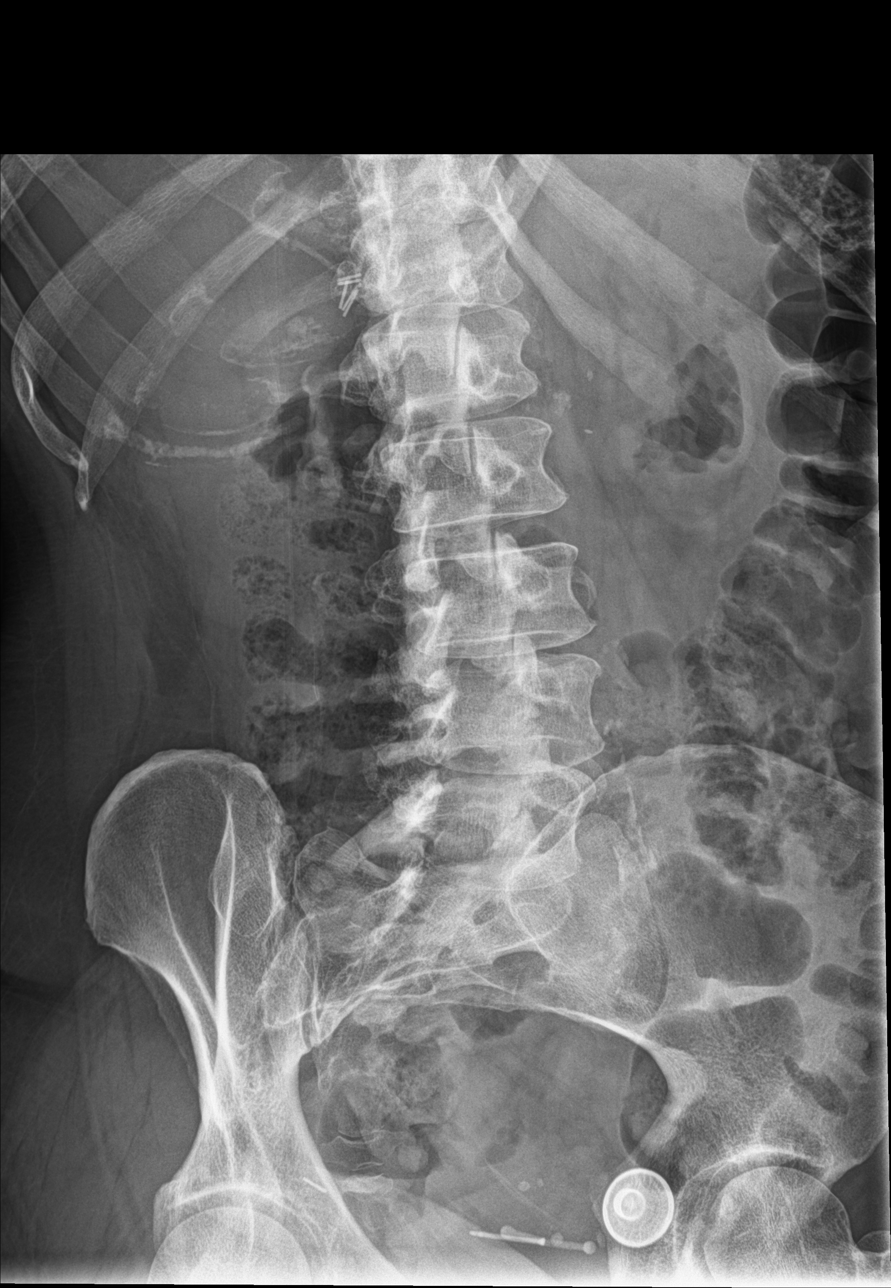

[5 of 5 positions shown; findings below may reference images not displayed]

FINDINGS: 5 mm anterolisthesis of L4 upon L5. Otherwise normal lumbar
lordosis. Mild lumbar dextroscoliosis, apex right at L2. No acute
fracture. Vertebral body height is preserved. There is
intervertebral disc space narrowing and endplate remodeling at L3-4
and L4-5 in keeping with changes of mild degenerative disc disease.
The paraspinal soft tissues are unremarkable. Intrauterine device
overlies the mid pelvis.
IMPRESSION: No acute fracture or listhesis.

Mild degenerative disc disease L3-L5 with grade 1 anterolisthesis at
L4-5.

## 2023-09-08 ENCOUNTER — Other Ambulatory Visit: Payer: Self-pay | Admitting: Family Medicine

## 2023-09-08 DIAGNOSIS — M5416 Radiculopathy, lumbar region: Secondary | ICD-10-CM

## 2023-09-08 DIAGNOSIS — M5431 Sciatica, right side: Secondary | ICD-10-CM

## 2023-09-08 DIAGNOSIS — G5701 Lesion of sciatic nerve, right lower limb: Secondary | ICD-10-CM

## 2023-09-08 NOTE — Telephone Encounter (Signed)
Requested medications are due for refill today.  yes  Requested medications are on the active medications list.  yes  Last refill. 08/27/2022 #[id number]  Future visit scheduled.   no  Notes to clinic.  Labs are expired. Pt needs an appt.    Requested Prescriptions  Pending Prescriptions Disp Refills   gabapentin (NEURONTIN) 100 MG capsule [Pharmacy Med Name: GABAPENTIN 100MG  CAPSULES] 90 capsule 3    Sig: TAKE 1 CAPSULE(100 MG) BY MOUTH AT BEDTIME     Neurology: Anticonvulsants - gabapentin Failed - 09/08/2023  3:51 AM      Failed - Cr in normal range and within 360 days    Creat  Date Value Ref Range Status  01/15/2022 0.63 0.50 - 0.99 mg/dL Final         Failed - Completed PHQ-2 or PHQ-9 in the last 360 days      Failed - Valid encounter within last 12 months    Recent Outpatient Visits           1 year ago Cervical cancer screening   [facility] Smitty Cords, DO   1 year ago Annual physical exam   [facility] Smitty Cords, DO   1 year ago Multilevel lumbosacral spondylosis with radiculopathy   [facility], Ocie Bob, MD   1 year ago Chronic sciatica, right   [facility] Smitty Cords, DO   1 year ago Chronic sciatica, right   [facility] at [facility], Ocie Bob, MD
